# Patient Record
Sex: Female | Born: 1950
Health system: Southern US, Community
[De-identification: ages and names within clinical notes are randomized; demographics above are authoritative.]

## PROBLEM LIST (undated history)

## (undated) DIAGNOSIS — C801 Malignant (primary) neoplasm, unspecified: Secondary | ICD-10-CM

## (undated) DIAGNOSIS — G629 Polyneuropathy, unspecified: Secondary | ICD-10-CM

## (undated) DIAGNOSIS — I739 Peripheral vascular disease, unspecified: Secondary | ICD-10-CM

## (undated) DIAGNOSIS — G459 Transient cerebral ischemic attack, unspecified: Secondary | ICD-10-CM

## (undated) DIAGNOSIS — I10 Essential (primary) hypertension: Secondary | ICD-10-CM

## (undated) DIAGNOSIS — E785 Hyperlipidemia, unspecified: Secondary | ICD-10-CM

## (undated) DIAGNOSIS — G9589 Other specified diseases of spinal cord: Secondary | ICD-10-CM

## (undated) DIAGNOSIS — J449 Chronic obstructive pulmonary disease, unspecified: Secondary | ICD-10-CM

## (undated) DIAGNOSIS — T7840XA Allergy, unspecified, initial encounter: Secondary | ICD-10-CM

## (undated) HISTORY — PX: CERVICAL CONE BIOPSY: SUR198

## (undated) HISTORY — PX: KNEE ARTHROSCOPY: SUR90

## (undated) HISTORY — DX: Allergy, unspecified, initial encounter: T78.40XA

## (undated) HISTORY — DX: Hyperlipidemia, unspecified: E78.5

## (undated) HISTORY — DX: Polyneuropathy, unspecified: G62.9

## (undated) HISTORY — PX: TUBAL LIGATION: SHX77

## (undated) HISTORY — PX: TONSILLECTOMY: SUR1361

---

## 2012-08-31 DIAGNOSIS — I10 Essential (primary) hypertension: Secondary | ICD-10-CM | POA: Insufficient documentation

## 2012-08-31 DIAGNOSIS — C4490 Unspecified malignant neoplasm of skin, unspecified: Secondary | ICD-10-CM | POA: Insufficient documentation

## 2012-08-31 DIAGNOSIS — R011 Cardiac murmur, unspecified: Secondary | ICD-10-CM | POA: Insufficient documentation

## 2013-04-14 DIAGNOSIS — R29898 Other symptoms and signs involving the musculoskeletal system: Secondary | ICD-10-CM | POA: Insufficient documentation

## 2013-09-08 ENCOUNTER — Ambulatory Visit: Payer: Self-pay | Admitting: Internal Medicine

## 2013-09-20 ENCOUNTER — Ambulatory Visit: Payer: Self-pay | Admitting: Internal Medicine

## 2014-05-25 DIAGNOSIS — R918 Other nonspecific abnormal finding of lung field: Secondary | ICD-10-CM | POA: Insufficient documentation

## 2014-07-07 HISTORY — PX: THYMECTOMY: SHX1063

## 2014-07-17 DIAGNOSIS — D4989 Neoplasm of unspecified behavior of other specified sites: Secondary | ICD-10-CM | POA: Insufficient documentation

## 2014-07-17 DIAGNOSIS — D150 Benign neoplasm of thymus: Secondary | ICD-10-CM

## 2015-02-17 ENCOUNTER — Encounter: Payer: Self-pay | Admitting: Internal Medicine

## 2015-02-17 ENCOUNTER — Other Ambulatory Visit: Payer: Self-pay | Admitting: Internal Medicine

## 2015-02-17 DIAGNOSIS — Z8742 Personal history of other diseases of the female genital tract: Secondary | ICD-10-CM | POA: Insufficient documentation

## 2015-02-17 DIAGNOSIS — E782 Mixed hyperlipidemia: Secondary | ICD-10-CM | POA: Insufficient documentation

## 2015-02-17 DIAGNOSIS — E785 Hyperlipidemia, unspecified: Secondary | ICD-10-CM | POA: Insufficient documentation

## 2015-02-17 DIAGNOSIS — F17200 Nicotine dependence, unspecified, uncomplicated: Secondary | ICD-10-CM | POA: Insufficient documentation

## 2015-02-17 DIAGNOSIS — M5136 Other intervertebral disc degeneration, lumbar region: Secondary | ICD-10-CM | POA: Insufficient documentation

## 2015-02-17 DIAGNOSIS — E559 Vitamin D deficiency, unspecified: Secondary | ICD-10-CM | POA: Insufficient documentation

## 2015-03-06 ENCOUNTER — Encounter: Payer: Self-pay | Admitting: Internal Medicine

## 2015-03-06 ENCOUNTER — Ambulatory Visit (INDEPENDENT_AMBULATORY_CARE_PROVIDER_SITE_OTHER): Payer: No Typology Code available for payment source | Admitting: Internal Medicine

## 2015-03-06 VITALS — BP 140/80 | HR 92 | Ht 64.0 in | Wt 166.0 lb

## 2015-03-06 DIAGNOSIS — G959 Disease of spinal cord, unspecified: Secondary | ICD-10-CM | POA: Diagnosis not present

## 2015-03-06 DIAGNOSIS — J449 Chronic obstructive pulmonary disease, unspecified: Secondary | ICD-10-CM | POA: Diagnosis not present

## 2015-03-06 DIAGNOSIS — D150 Benign neoplasm of thymus: Secondary | ICD-10-CM | POA: Diagnosis not present

## 2015-03-06 DIAGNOSIS — M5136 Other intervertebral disc degeneration, lumbar region: Secondary | ICD-10-CM

## 2015-03-06 DIAGNOSIS — R918 Other nonspecific abnormal finding of lung field: Secondary | ICD-10-CM

## 2015-03-06 DIAGNOSIS — F17200 Nicotine dependence, unspecified, uncomplicated: Secondary | ICD-10-CM | POA: Diagnosis not present

## 2015-03-06 DIAGNOSIS — D4989 Neoplasm of unspecified behavior of other specified sites: Secondary | ICD-10-CM

## 2015-03-06 MED ORDER — DICLOFENAC SODIUM 75 MG PO TBEC: 75.0000 mg | DELAYED_RELEASE_TABLET | Freq: Two times a day (BID) | ORAL | Status: DC

## 2015-03-06 MED ORDER — FLUTICASONE-SALMETEROL 100-50 MCG/DOSE IN AEPB: 1.0000 | INHALATION_SPRAY | Freq: Two times a day (BID) | RESPIRATORY_TRACT | Status: DC

## 2015-03-06 MED ORDER — AMOXICILLIN 875 MG PO TABS: 875.0000 mg | ORAL_TABLET | Freq: Two times a day (BID) | ORAL | Status: DC

## 2015-03-06 MED ORDER — ALBUTEROL SULFATE HFA 108 (90 BASE) MCG/ACT IN AERS: 2.0000 | INHALATION_SPRAY | Freq: Four times a day (QID) | RESPIRATORY_TRACT | Status: DC | PRN

## 2015-03-06 NOTE — Progress Notes (Signed)
Date:  03/06/2015   Name:  Chelsea Santana   DOB:  23-Jun-1950   MRN:  [id number]   Chief Complaint: Hypertension; Hyperlipidemia; and Back Pain  Patient had a busy year. She had thymoma resection in April. She's been declared free of malignancy at this time and needs no further follow-up on that. She also was found to have a nonspecific lung mass. Pulmonary consultation was performed last February and they recommended a PET scan. For unclear reasons the patient did not follow-up with that scan. She was also found to have a lesion in one of her vertebral bodies. There was suggestion of this might be related to a rheumatological condition with the specialist did not find any evidence. She is not sure what her follow-up should be. She continues to have chronic back pain as well as multiple other joint pains. She takes diclofenac 75 mg twice a day. She tolerated this medication without side effects and is due for a refill. She also reports that she has fluctuating blood pressures as well as hyperlipidemia. However per her report, she's been unable to tolerate any medications for these conditions. COPD - patient reports that she was given an Advair inhaler by pulmonary but she never filled the prescription. She continues to smoke cigarettes one pack per day. She admits to having wheezing on a daily basis but generally functions well. Recently she developed some sinus drainage with green mucus as well as a cough productive of thick green phlegm.  Review of Systems  Constitutional: Negative for fever, chills, fatigue and unexpected weight change.  HENT: Positive for postnasal drip and sinus pressure. Negative for ear pain, sneezing, sore throat, trouble swallowing and voice change.   Eyes: Negative for visual disturbance.  Respiratory: Positive for cough, shortness of breath and wheezing.   Cardiovascular: Negative for chest pain, palpitations and leg swelling.  Gastrointestinal: Negative for abdominal pain,  diarrhea and constipation.  Musculoskeletal: Positive for myalgias, back pain and arthralgias.  Skin: Negative for color change and rash.  Neurological: Positive for dizziness. Negative for syncope, numbness and headaches.  Psychiatric/Behavioral: Positive for dysphoric mood. Negative for suicidal ideas and sleep disturbance. The patient is not nervous/anxious.     Patient Active Problem List   Diagnosis Date Noted  . Disease of spinal cord ([place]) 03/06/2015  . Degeneration of intervertebral disc of lumbar region 02/17/2015  . H/O abnormal cervical Papanicolaou smear 02/17/2015  . Combined fat and carbohydrate induced hyperlipemia 02/17/2015  . Compulsive tobacco user syndrome 02/17/2015  . Avitaminosis D 02/17/2015  . Thymoma 07/17/2014  . Chronic obstructive pulmonary disease ([place]) 05/25/2014  . Lung mass 05/25/2014  . Leg weakness 04/14/2013  . Cardiac murmur 08/31/2012  . BP (high blood pressure) 08/31/2012  . CA of skin 08/31/2012    Prior to Admission medications   Medication Sig Start Date End Date Taking? Authorizing Provider  Cholecalciferol (VITAMIN D) 2000 UNITS CAPS Take 1 capsule by mouth daily.   Yes Historical Provider, MD  diclofenac (VOLTAREN) 75 MG EC tablet Take 1 tablet by mouth 2 (two) times daily. 04/14/14  Yes Historical Provider, MD  Multiple Vitamins-Minerals (WOMENS 50+ [place] VITAMIN/MIN) TABS Take by mouth.   Yes Historical Provider, MD  OMEGA-3 FATTY ACIDS PO Take by mouth.   Yes Historical Provider, MD    Allergies  Allergen Reactions  . Antihistamines, Chlorpheniramine-Type Other (See Comments)    Makes her feel high  . Fish-Derived Products Other (See Comments)  . Prednisone     lethargy  and anger  . Lipoic Acid Other (See Comments)    Numbness in hands    Past Surgical History  Procedure Laterality Date  . Cervical cone biopsy    . Tonsillectomy    . Tubal ligation    . Knee arthroscopy Bilateral     Social History  Substance Use Topics   . Smoking status: Current Every Day Smoker  . Smokeless tobacco: None  . Alcohol Use: No    Medication list has been reviewed and updated.   Physical Exam  Constitutional: She is oriented to person, place, and time. She appears well-developed. No distress.  HENT:  Head: Normocephalic and atraumatic.  Eyes: Conjunctivae are normal. Right eye exhibits no discharge. Left eye exhibits no discharge. No scleral icterus.  Neck: Carotid bruit is not present.  Cardiovascular: Normal rate, regular rhythm and normal heart sounds.   Pulmonary/Chest: Effort normal. No respiratory distress. She has wheezes. She has rhonchi in the right upper field.  Musculoskeletal: Normal range of motion.  Neurological: She is alert and oriented to person, place, and time.  Skin: Skin is warm and dry. No rash noted.     Psychiatric: She has a normal mood and affect. Her speech is normal and behavior is normal. Thought content normal.    BP 140/80 mmHg  Pulse 92  Ht 5\' 4"  (1.626 m)  Wt 166 lb (75.297 kg)  BMI 28.48 kg/m2  Assessment and Plan: 1. Chronic obstructive pulmonary disease, unspecified COPD type ([place]) With exacerbation and not currently under maintenance therapy - albuterol (PROVENTIL HFA;VENTOLIN HFA) 108 (90 BASE) MCG/ACT inhaler; Inhale 2 puffs into the lungs every 6 (six) hours as needed for wheezing or shortness of breath.  Dispense: 1 Inhaler; Refill: 5 - amoxicillin (AMOXIL) 875 MG tablet; Take 1 tablet (875 mg total) by mouth 2 (two) times daily.  Dispense: 20 tablet; Refill: 0 - Fluticasone-Salmeterol (ADVAIR) 100-50 MCG/DOSE AEPB; Inhale 1 puff into the lungs 2 (two) times daily.  Dispense: 1 each; Refill: 3  2. Degeneration of intervertebral disc of lumbar region Continue current regimen of nonsteroidals - diclofenac (VOLTAREN) 75 MG EC tablet; Take 1 tablet (75 mg total) by mouth 2 (two) times daily.  Dispense: 60 tablet; Refill: 5  3. Compulsive tobacco user syndrome Patient has  no interest in quitting at this time  4. Thymoma Doing well s/p surgery Recent follow up done at [facility]  5. Lung mass Needs to follow up with Pulmonary but changing insurance Will discuss at next appointment in February  6. Disease of spinal cord ([place]) Recent workup suggested benign etiology   Halina Maidens, MD [facility]  03/06/2015

## 2015-05-20 ENCOUNTER — Encounter: Payer: Self-pay | Admitting: Internal Medicine

## 2015-05-20 DIAGNOSIS — I2510 Atherosclerotic heart disease of native coronary artery without angina pectoris: Secondary | ICD-10-CM | POA: Insufficient documentation

## 2015-05-21 ENCOUNTER — Ambulatory Visit (INDEPENDENT_AMBULATORY_CARE_PROVIDER_SITE_OTHER): Payer: BLUE CROSS/BLUE SHIELD | Admitting: Internal Medicine

## 2015-05-21 ENCOUNTER — Encounter: Payer: Self-pay | Admitting: Internal Medicine

## 2015-05-21 VITALS — BP 144/78 | HR 72 | Ht 64.0 in | Wt 167.6 lb

## 2015-05-21 DIAGNOSIS — I2510 Atherosclerotic heart disease of native coronary artery without angina pectoris: Secondary | ICD-10-CM | POA: Diagnosis not present

## 2015-05-21 DIAGNOSIS — E559 Vitamin D deficiency, unspecified: Secondary | ICD-10-CM

## 2015-05-21 DIAGNOSIS — J438 Other emphysema: Secondary | ICD-10-CM

## 2015-05-21 DIAGNOSIS — I10 Essential (primary) hypertension: Secondary | ICD-10-CM

## 2015-05-21 DIAGNOSIS — I712 Thoracic aortic aneurysm, without rupture: Secondary | ICD-10-CM

## 2015-05-21 DIAGNOSIS — Z87898 Personal history of other specified conditions: Secondary | ICD-10-CM

## 2015-05-21 DIAGNOSIS — L259 Unspecified contact dermatitis, unspecified cause: Secondary | ICD-10-CM | POA: Diagnosis not present

## 2015-05-21 DIAGNOSIS — Z23 Encounter for immunization: Secondary | ICD-10-CM | POA: Diagnosis not present

## 2015-05-21 DIAGNOSIS — G959 Disease of spinal cord, unspecified: Secondary | ICD-10-CM | POA: Diagnosis not present

## 2015-05-21 DIAGNOSIS — Z0000 Encounter for general adult medical examination without abnormal findings: Secondary | ICD-10-CM

## 2015-05-21 DIAGNOSIS — I7121 Aneurysm of the ascending aorta, without rupture: Secondary | ICD-10-CM

## 2015-05-21 DIAGNOSIS — Z86018 Personal history of other benign neoplasm: Secondary | ICD-10-CM

## 2015-05-21 LAB — POCT URINALYSIS DIPSTICK
Bilirubin, UA: NEGATIVE
Blood, UA: NEGATIVE
Glucose, UA: NEGATIVE
KETONES UA: NEGATIVE
Leukocytes, UA: NEGATIVE
Nitrite, UA: NEGATIVE
PROTEIN UA: NEGATIVE
Urobilinogen, UA: 0.2
pH, UA: 6.5

## 2015-05-21 MED ORDER — TRIAMCINOLONE ACETONIDE 0.1 % EX CREA: 1.0000 "application " | TOPICAL_CREAM | Freq: Two times a day (BID) | CUTANEOUS | Status: DC

## 2015-05-21 NOTE — Progress Notes (Signed)
Date:  05/21/2015   Name:  Chelsea Santana   DOB:  08-18-1950   MRN:  [id number]   Chief Complaint: Annual Exam Chelsea Santana is a 65 y.o. female who presents today for her Complete Annual Exam. She feels fairly well. She reports exercising very little. She reports she is sleeping fairly well. She denies breast problems and does her own exam.  She does not want to have another mammogram this year. She says she feels fairly well with respect to her COPD. She is not especially interested in any interventions like mammograms or vaccinations although she would take a tetanus vaccine.  Hyperlipidemia Associated symptoms include shortness of breath. Pertinent negatives include no chest pain.    COPD - using Advair twice a day but avoids using it because she really wants to smoke first thing in the AM.  She is not exercising.  She has chronic am phlegm - yellow/brown.  She denies much wheezing. She is never quit smoking other than 1 week prior to her surgery last year. She does not feel that she has the ability to quit due to recurrent anxiety and stressors which lead to her smoking.  Aneurysm - I noted this on a scan done last year prior to her thymoma surgery. At the time the thymoma mass was noted an asymmetry in the aortic aneurysm measuring 3.8 x 4 cm was also noted. The patient states that she was not given this information and it was not addressed prior to or following her surgery.  Below is scan dated 05/26/14: CT chest with contrast CT abdomen and pelvis with contrast  Comparison: Correlation   Indication: R91.1 Solitary pulmonary nodule, Evaluate right lung lesion, eval neoplasia  Technique: CT imaging was performed of the chest, abdomen, and pelvis following the uncomplicated administration of intravenous contrast (Isovue-300, 150 mL at 3 mL/sec). Iodinated contrast was used due to the indications for the examination, to improve disease detection and to further define anatomy. The  most recent serum creatinine is 0.7 mg/dL. 3-D maximal intensity projection (MIP) reconstructions of the chest were performed to potentially increase study sensitivity. Coronal images were also generated and reviewed.  Findings: Chest:   The thyroid is normal. There is a prevascular soft tissue mass in the anterior mediastinum measuring 3.0 x 3.8 x 4.1 cm in the axial plane. There is an ascending aortic aneurysm measuring 4.0 x 3.8 cm. Coronary artery calcifications are seen. The heart is not enlarged. The tracheobronchial tree is patent. No pulmonary nodules or effusions are seen. There is no pneumothorax.  Abdomen/pelvis:  There is no evidence of liver mass. The gallbladder, spleen, pancreas, adrenal glands, and kidneys have an unremarkable appearance. There is no bowel obstruction. The appendix is not definitively seen, but there are no secondary signs of appendicitis. The bladder is unremarkable. Tubal ligation clips are seen in the pelvis. Calcified plaque is seen in the abdominal aorta. There is no free intraperitoneal air or free fluid. No lymphadenopathy is seen.  Bone: No aggressive osseous lesions are seen. There is degenerative change in the lower lumbar spine.  Impression: Soft tissue mass in the anterior mediastinal is favored to be a thymoma.  Electronically Reviewed by:  Lajean Saver, MD Electronically Reviewed on:  05/26/2014 4:06 PM  I have reviewed the images and concur with the above findings.  Electronically Signed by:  Jerrye Bushy, MD Electronically Signed on:  05/26/2014 4:12 PM  Thoracic spine lesion - [name] patient was noted to have a hyperintense  lesion on the right T2 lateral cord. She was intended to have a neurosurgery follow-up by insurance changed and she can no longer go to [facility]. She is very concerned about this lesion and wants referral to a neurosurgeon.  ** MRI THORACIC SPINE WITHOUT CONTRAST ** 05/19/14  INDICATION: M62.81 Muscle weakness  (generalized), G60.9 Hereditary and idiopathic neuropathy, unspecified, G95.9 Disease of spinal cord, unspecified, evaluate myelopathy, right trunk and leg numbness. provided.  COMPARISON: No priors.  TECHNIQUE/PROTOCOL: Thoracic spine noncontrast protocol MRI performed.  FINDINGS: Mild S-shaped scoliosis throughout the thoracic spine. Otherwise, alignment is anatomic. No acute fracture or subluxation. Multilevel Schmorl's nodes noted. Few T1, T2 hyperintense hemangiomas noted. Otherwise, marrow signal is unremarkable. Minimal multilevel degenerative disc changes noted. No significant spinal canal or neuroforaminal narrowing at any level.  Area of T2 hyperintense signal abnormality noted along the right dorsolateral thoracic cord posterior to T5 and T6 which measures up to 0.4 cm in diameter and spans approximately 1.9 cm craniocaudal. Suggestion of mild thinning of the cord at this level. No additional cord signal abnormalities appreciated.  T1 isointense, mildly T2 hyperintense somewhat lobular appearing lesion noted along the anterior aspect of the right midlung which measures approximately 3.2 x 5.5 cm (AP x craniocaudal). Otherwise, included soft tissue structures are unremarkable.  IMPRESSION: 1.Area of T2 hyperintense signal abnormality at the right dorsolateral thoracic cord posterior to T5 and T6 with suggestion of mild thinning of the cord. Finding is nonspecific and may represent chronic myelomalacia secondary to prior demyelination or other myelitis. Active cord lesion remains possible. Recommend further evaluation with contrast-enhanced thoracic spine MRI. 2.Lobular mass noted along the anterior aspect of the right midlung concerning for neoplasm. Recommend dedicated evaluation with chest CT.  Findings discussed with Dr. Ernst Bowler of neurology via telephone by Dr. Lang Snow 05/19/2014 1335 hours.  Electronically Reviewed by: Marlaine Hind, MD Electronically Reviewed  on: 05/19/2014 5:30 PM   Review of Systems  Constitutional: Negative for fever, chills, fatigue and unexpected weight change.  HENT: Negative for congestion and sinus pressure.   Respiratory: Positive for cough and shortness of breath. Negative for chest tightness and wheezing.   Cardiovascular: Negative for chest pain, palpitations and leg swelling.  Gastrointestinal: Negative for abdominal pain, diarrhea, constipation and blood in stool.  Genitourinary: Negative for dysuria, hematuria, vaginal bleeding, vaginal discharge and vaginal pain.  Neurological: Negative for dizziness, tremors, syncope, light-headedness and headaches.  Psychiatric/Behavioral: Positive for sleep disturbance and dysphoric mood. The patient is not nervous/anxious.     Patient Active Problem List   Diagnosis Date Noted  . Ascending aortic aneurysm ([place]) 05/20/2015  . Coronary artery calcification seen on CT scan 05/20/2015  . Disease of spinal cord ([place]) 03/06/2015  . Degeneration of intervertebral disc of lumbar region 02/17/2015  . H/O abnormal cervical Papanicolaou smear 02/17/2015  . Compulsive tobacco user syndrome 02/17/2015  . Avitaminosis D 02/17/2015  . Thymoma 07/17/2014  . Chronic obstructive pulmonary disease ([place]) 05/25/2014  . Leg weakness 04/14/2013  . Cardiac murmur 08/31/2012  . Essential hypertension 08/31/2012  . CA of skin 08/31/2012    Prior to Admission medications   Medication Sig Start Date End Date Taking? Authorizing Provider  albuterol (PROVENTIL HFA;VENTOLIN HFA) 108 (90 BASE) MCG/ACT inhaler Inhale 2 puffs into the lungs every 6 (six) hours as needed for wheezing or shortness of breath. 03/06/15  Yes [name], MD  Cholecalciferol (VITAMIN D) 2000 UNITS CAPS Take 1 capsule by mouth daily.   Yes Historical Provider, MD  diclofenac (VOLTAREN) 75 MG EC tablet Take 1 tablet (75 mg total) by mouth 2 (two) times daily. 03/06/15  Yes [name], MD  ferrous sulfate (SLOW FE)  160 (50 Fe) MG TBCR SR tablet Take 1 tablet by mouth daily.   Yes Historical Provider, MD  Fluticasone-Salmeterol (ADVAIR) 100-50 MCG/DOSE AEPB Inhale 1 puff into the lungs 2 (two) times daily. 03/06/15  Yes [name], MD  Multiple Vitamins-Minerals (WOMENS 50+ [place] VITAMIN/MIN) TABS Take by mouth.   Yes Historical Provider, MD  OMEGA-3 FATTY ACIDS PO Take by mouth.   Yes Historical Provider, MD    Allergies  Allergen Reactions  . Antihistamines, Chlorpheniramine-Type Other (See Comments)    Makes her feel high  . Oxymetazoline Other (See Comments)    Makes her "high"  . Prednisone     lethargy and anger  . Lipoic Acid Other (See Comments)    Numbness in hands    Past Surgical History  Procedure Laterality Date  . Cervical cone biopsy    . Tonsillectomy    . Tubal ligation    . Knee arthroscopy Bilateral   . Thymectomy  07/2014    Social History  Substance Use Topics  . Smoking status: Current Every Day Smoker -- 1.00 packs/day for 48 years    Types: Cigarettes  . Smokeless tobacco: None  . Alcohol Use: No     Medication list has been reviewed and updated.   Physical Exam  Constitutional: She is oriented to person, place, and time. She appears well-developed and well-nourished. No distress.  HENT:  Head: Normocephalic and atraumatic.  Right Ear: Tympanic membrane and ear canal normal.  Left Ear: Tympanic membrane and ear canal normal.  Nose: Right sinus exhibits no maxillary sinus tenderness. Left sinus exhibits no maxillary sinus tenderness.  Mouth/Throat: Uvula is midline and oropharynx is clear and moist.  Eyes: Conjunctivae and EOM are normal. Right eye exhibits no discharge. Left eye exhibits no discharge. No scleral icterus.  Neck: Normal range of motion. Carotid bruit is not present. No erythema present. No thyromegaly present.  Cardiovascular: Normal rate, regular rhythm, normal heart sounds and normal pulses.   Pulmonary/Chest: Effort normal. No  accessory muscle usage. No respiratory distress. She has decreased breath sounds. She has no wheezes. Right breast exhibits no mass, no nipple discharge, no skin change and no tenderness. Left breast exhibits no mass, no nipple discharge, no skin change and no tenderness.  Abdominal: Soft. Bowel sounds are normal. There is no hepatosplenomegaly. There is no tenderness. There is no CVA tenderness.  Musculoskeletal: Normal range of motion.  Lymphadenopathy:    She has no cervical adenopathy.    She has no axillary adenopathy.  Neurological: She is alert and oriented to person, place, and time. She has normal reflexes. No cranial nerve deficit or sensory deficit.  Skin: Skin is warm, dry and intact. Rash noted. Rash is vesicular (along inner left forearm (linear pattern) and several scatted lesions on left hand and fingers).  Psychiatric: She has a normal mood and affect. Her speech is normal and behavior is normal. Thought content normal.  Nursing note and vitals reviewed.   BP 144/78 mmHg  Pulse 72  Ht 5\' 4"  (1.626 m)  Wt 167 lb 9.6 oz (76.023 kg)  BMI 28.75 kg/m2  Assessment and Plan: 1. Annual physical exam Patient declines mammogram - Lipid panel - POCT Urinalysis Dipstick  2. Essential hypertension Fair control - higher likely due to discussion regarding aneurysm -  CBC with Differential/Platelet - Comprehensive metabolic panel - TSH  3. Ascending aortic aneurysm [facility]) - Ambulatory referral to Vascular Surgery  4. Other emphysema ([place]) Pt encouraged to cut back on smoking - although she is not interested at this time Pt desires to hold off on Pulmonary referral since her symptoms are stable at this time Continue Advair   5. Coronary artery calcification seen on CT scan Would benefit from statin therapy She has not tolerated several in the past  6. Need for diphtheria-tetanus-pertussis (Tdap) vaccine - Tdap vaccine greater than or equal to 7yo IM  7. Contact  dermatitis - triamcinolone cream (KENALOG) 0.1 %; Apply 1 application topically 2 (two) times daily.  Dispense: 30 g; Refill: 0  8. History of thymoma Due for Oncology followup (one year) - Ambulatory referral to Oncology  9. Avitaminosis D Will advise on supplementation - VITAMIN D 25 Hydroxy (Vit-D Deficiency, Fractures)  10. Disease of spinal cord ([facility]) Focal T2 hyperintense lesion within the right dorsal lateral cord at this T5-T6 appears unchanged. There is no associated cord expansion or contrast-enhancement on MRI 2016 -Ambulatory referral to Neurosurgery  Halina Maidens, MD [facility]  05/21/2015

## 2015-05-21 NOTE — Patient Instructions (Signed)
Tdap Vaccine (Tetanus, Diphtheria and Pertussis): What You Need to Know 1. Why get vaccinated? Tetanus, diphtheria and pertussis are very serious diseases. Tdap vaccine can protect us from these diseases. And, Tdap vaccine given to pregnant women can protect newborn babies against pertussis. TETANUS (Lockjaw) is rare in the [place] today. It causes painful muscle tightening and stiffness, usually all over the body.  It can lead to tightening of muscles in the head and neck so you can't open your mouth, swallow, or sometimes even breathe. Tetanus kills about 1 out of 10 people who are infected even after receiving the best medical care. DIPHTHERIA is also rare in the [place] today. It can cause a thick coating to form in the back of the throat.  It can lead to breathing problems, heart failure, paralysis, and death. PERTUSSIS (Whooping Cough) causes severe coughing spells, which can cause difficulty breathing, vomiting and disturbed sleep.  It can also lead to weight loss, incontinence, and rib fractures. Up to 2 in 100 adolescents and 5 in 100 adults with pertussis are hospitalized or have complications, which could include pneumonia or death. These diseases are caused by bacteria. Diphtheria and pertussis are spread from person to person through secretions from coughing or sneezing. Tetanus enters the body through cuts, scratches, or wounds. Before vaccines, as many as 200,000 cases of diphtheria, 200,000 cases of pertussis, and hundreds of cases of tetanus, were reported in the [place] each year. Since vaccination began, reports of cases for tetanus and diphtheria have dropped by about 99% and for pertussis by about 80%. 2. Tdap vaccine Tdap vaccine can protect adolescents and adults from tetanus, diphtheria, and pertussis. One dose of Tdap is routinely given at age 11 or 12. People who did not get Tdap at that age should get it as soon as possible. Tdap is especially important  for healthcare professionals and anyone having close contact with a baby younger than 12 months. Pregnant women should get a dose of Tdap during every pregnancy, to protect the newborn from pertussis. Infants are most at risk for severe, life-threatening complications from pertussis. Another vaccine, called Td, protects against tetanus and diphtheria, but not pertussis. A Td booster should be given every 10 years. Tdap may be given as one of these boosters if you have never gotten Tdap before. Tdap may also be given after a severe cut or burn to prevent tetanus infection. Your doctor or the person giving you the vaccine can give you more information. Tdap may safely be given at the same time as other vaccines. 3. Some people should not get this vaccine  A person who has ever had a life-threatening allergic reaction after a previous dose of any diphtheria, tetanus or pertussis containing vaccine, OR has a severe allergy to any part of this vaccine, should not get Tdap vaccine. Tell the person giving the vaccine about any severe allergies.  Anyone who had coma or long repeated seizures within 7 days after a childhood dose of DTP or DTaP, or a previous dose of Tdap, should not get Tdap, unless a cause other than the vaccine was found. They can still get Td.  Talk to your doctor if you:  have seizures or another nervous system problem,  had severe pain or swelling after any vaccine containing diphtheria, tetanus or pertussis,  ever had a condition called Guillain-Barr Syndrome (GBS),  aren't feeling well on the day the shot is scheduled. 4. Risks With any medicine, including vaccines, there is   a chance of side effects. These are usually mild and go away on their own. Serious reactions are also possible but are rare. Most people who get Tdap vaccine do not have any problems with it. Mild problems following Tdap (Did not interfere with activities)  Pain where the shot was given (about 3 in 4  adolescents or 2 in 3 adults)  Redness or swelling where the shot was given (about 1 person in 5)  Mild fever of at least 100.4F (up to about 1 in 25 adolescents or 1 in 100 adults)  Headache (about 3 or 4 people in 10)  Tiredness (about 1 person in 3 or 4)  Nausea, vomiting, diarrhea, stomach ache (up to 1 in 4 adolescents or 1 in 10 adults)  Chills, sore joints (about 1 person in 10)  Body aches (about 1 person in 3 or 4)  Rash, swollen glands (uncommon) Moderate problems following Tdap (Interfered with activities, but did not require medical attention)  Pain where the shot was given (up to 1 in 5 or 6)  Redness or swelling where the shot was given (up to about 1 in 16 adolescents or 1 in 12 adults)  Fever over 102F (about 1 in 100 adolescents or 1 in 250 adults)  Headache (about 1 in 7 adolescents or 1 in 10 adults)  Nausea, vomiting, diarrhea, stomach ache (up to 1 or 3 people in 100)  Swelling of the entire arm where the shot was given (up to about 1 in 500). Severe problems following Tdap (Unable to perform usual activities; required medical attention)  Swelling, severe pain, bleeding and redness in the arm where the shot was given (rare). Problems that could happen after any vaccine:  People sometimes faint after a medical procedure, including vaccination. Sitting or lying down for about 15 minutes can help prevent fainting, and injuries caused by a fall. Tell your doctor if you feel dizzy, or have vision changes or ringing in the ears.  Some people get severe pain in the shoulder and have difficulty moving the arm where a shot was given. This happens very rarely.  Any medication can cause a severe allergic reaction. Such reactions from a vaccine are very rare, estimated at fewer than 1 in a million doses, and would happen within a few minutes to a few hours after the vaccination. As with any medicine, there is a very remote chance of a vaccine causing a serious  injury or death. The safety of vaccines is always being monitored. For more information, visit: www.cdc.gov/vaccinesafety/ 5. What if there is a serious problem? What should I look for?  Look for anything that concerns you, such as signs of a severe allergic reaction, very high fever, or unusual behavior.  Signs of a severe allergic reaction can include hives, swelling of the face and throat, difficulty breathing, a fast heartbeat, dizziness, and weakness. These would usually start a few minutes to a few hours after the vaccination. What should I do?  If you think it is a severe allergic reaction or other emergency that can't wait, call 9-1-1 or get the person to the nearest hospital. Otherwise, call your doctor.  Afterward, the reaction should be reported to the Vaccine Adverse Event Reporting System (VAERS). Your doctor might file this report, or you can do it yourself through the VAERS web site at www.vaers.hhs.gov, or by calling 1-800-822-7967. VAERS does not give medical advice.  6. The National Vaccine Injury Compensation Program The National Vaccine Injury Compensation Program (  VICP) is a federal program that was created to compensate people who may have been injured by certain vaccines. Persons who believe they may have been injured by a vaccine can learn about the program and about filing a claim by calling 1-800-338-2382 or visiting the VICP website at www.hrsa.gov/vaccinecompensation. There is a time limit to file a claim for compensation. 7. How can I learn more?  Ask your doctor. He or she can give you the vaccine package insert or suggest other sources of information.  Call your local or state health department.  Contact the Centers for Disease Control and Prevention (CDC):  Call 1-800-232-4636 (1-800-CDC-INFO) or  Visit CDC's website at www.cdc.gov/vaccines CDC Tdap Vaccine VIS (05/31/13)   This information is not intended to replace advice given to you by your health care  provider. Make sure you discuss any questions you have with your health care provider.   Document Released: 09/23/2011 Document Revised: 04/14/2014 Document Reviewed: 07/06/2013 Elsevier Interactive Patient Education 2016 Elsevier Inc.  

## 2015-05-23 LAB — COMPREHENSIVE METABOLIC PANEL
ALK PHOS: 67 IU/L (ref 39–117)
ALT: 18 IU/L (ref 0–32)
AST: 18 IU/L (ref 0–40)
Albumin/Globulin Ratio: 1.6 (ref 1.1–2.5)
Albumin: 4.2 g/dL (ref 3.6–4.8)
BUN/Creatinine Ratio: 11 (ref 11–26)
BUN: 7 mg/dL — AB (ref 8–27)
Bilirubin Total: 0.3 mg/dL (ref 0.0–1.2)
CALCIUM: 9.2 mg/dL (ref 8.7–10.3)
CO2: 23 mmol/L (ref 18–29)
CREATININE: 0.66 mg/dL (ref 0.57–1.00)
Chloride: 97 mmol/L (ref 96–106)
GFR calc Af Amer: 109 mL/min/{1.73_m2} (ref >59)
GFR, EST NON AFRICAN AMERICAN: 95 mL/min/{1.73_m2} (ref >59)
GLUCOSE: 85 mg/dL (ref 65–99)
Globulin, Total: 2.7 g/dL (ref 1.5–4.5)
Potassium: 4.8 mmol/L (ref 3.5–5.2)
SODIUM: 137 mmol/L (ref 134–144)
Total Protein: 6.9 g/dL (ref 6.0–8.5)

## 2015-05-23 LAB — CBC WITH DIFFERENTIAL/PLATELET
BASOS ABS: 0 10*3/uL (ref 0.0–0.2)
Basos: 0 %
EOS (ABSOLUTE): 0.1 10*3/uL (ref 0.0–0.4)
Eos: 1 %
HEMOGLOBIN: 12.6 g/dL (ref 11.1–15.9)
Hematocrit: 38.9 % (ref 34.0–46.6)
Immature Grans (Abs): 0 10*3/uL (ref 0.0–0.1)
Immature Granulocytes: 0 %
LYMPHS ABS: 1.9 10*3/uL (ref 0.7–3.1)
Lymphs: 23 %
MCH: 29.8 pg (ref 26.6–33.0)
MCHC: 32.4 g/dL (ref 31.5–35.7)
MCV: 92 fL (ref 79–97)
MONOCYTES: 8 %
MONOS ABS: 0.7 10*3/uL (ref 0.1–0.9)
NEUTROS ABS: 5.6 10*3/uL (ref 1.4–7.0)
Neutrophils: 68 %
Platelets: 352 10*3/uL (ref 150–379)
RBC: 4.23 x10E6/uL (ref 3.77–5.28)
RDW: 14.3 % (ref 12.3–15.4)
WBC: 8.3 10*3/uL (ref 3.4–10.8)

## 2015-05-23 LAB — LIPID PANEL
CHOL/HDL RATIO: 3 ratio (ref 0.0–4.4)
CHOLESTEROL TOTAL: 201 mg/dL — AB (ref 100–199)
HDL: 68 mg/dL (ref >39)
LDL CALC: 120 mg/dL — AB (ref 0–99)
TRIGLYCERIDES: 63 mg/dL (ref 0–149)
VLDL CHOLESTEROL CAL: 13 mg/dL (ref 5–40)

## 2015-05-23 LAB — VITAMIN D 25 HYDROXY (VIT D DEFICIENCY, FRACTURES): Vit D, 25-Hydroxy: 34 ng/mL (ref 30.0–100.0)

## 2015-05-23 LAB — TSH: TSH: 0.854 u[IU]/mL (ref 0.450–4.500)

## 2015-05-24 ENCOUNTER — Telehealth: Payer: Self-pay

## 2015-05-24 NOTE — Telephone Encounter (Signed)
-----   Message from [name], MD sent at 05/24/2015  1:19 PM EST ----- Vitamin D is low normal range.  Continue daily supplement.  All other labs are normal.

## 2015-05-24 NOTE — Telephone Encounter (Signed)
Spoke with patient. Patient advised of all results and verbalized understanding. Will call back with any future questions or concerns. MAH  

## 2015-05-31 ENCOUNTER — Other Ambulatory Visit: Payer: Self-pay | Admitting: Vascular Surgery

## 2015-05-31 DIAGNOSIS — I716 Thoracoabdominal aortic aneurysm, without rupture, unspecified: Secondary | ICD-10-CM

## 2015-05-31 DIAGNOSIS — R109 Unspecified abdominal pain: Secondary | ICD-10-CM

## 2015-05-31 DIAGNOSIS — R079 Chest pain, unspecified: Secondary | ICD-10-CM

## 2015-06-01 ENCOUNTER — Inpatient Hospital Stay: Payer: BLUE CROSS/BLUE SHIELD | Attending: Oncology | Admitting: Oncology

## 2015-06-01 VITALS — BP 157/96 | HR 72 | Temp 97.9°F | Resp 20 | Wt 167.5 lb

## 2015-06-01 DIAGNOSIS — F17210 Nicotine dependence, cigarettes, uncomplicated: Secondary | ICD-10-CM | POA: Insufficient documentation

## 2015-06-01 DIAGNOSIS — Z801 Family history of malignant neoplasm of trachea, bronchus and lung: Secondary | ICD-10-CM | POA: Insufficient documentation

## 2015-06-01 DIAGNOSIS — D4989 Neoplasm of unspecified behavior of other specified sites: Secondary | ICD-10-CM

## 2015-06-01 DIAGNOSIS — Z79899 Other long term (current) drug therapy: Secondary | ICD-10-CM | POA: Diagnosis not present

## 2015-06-01 DIAGNOSIS — D150 Benign neoplasm of thymus: Secondary | ICD-10-CM

## 2015-06-01 DIAGNOSIS — Z87898 Personal history of other specified conditions: Secondary | ICD-10-CM | POA: Insufficient documentation

## 2015-06-01 NOTE — Progress Notes (Signed)
Patient here today for new evaluation regarding thymoma. Patient reports being diagnosed and treated at [facility] about 1 year ago, reports most recent scan in October. Patient reports she is here today to establish care.

## 2015-06-01 NOTE — Progress Notes (Signed)
[place]  [phone] Fax:([phone]) [phone]  ID: Billy Coast OB: 05-23-1950  MR#: [id number]  [id number]  Patient Care Team: [name], MD as PCP - General (Family Medicine)  CHIEF COMPLAINT:  Chief Complaint  Patient presents with  . New Evaluation    Thymoma    INTERVAL HISTORY: Patient is a 65 year old female who proximally one year ago was having a workup for right sided numbness and tingling by neurology. This included a CT of the chest which revealed a mediastinal mass. Subsequent surgery revealed a stage IIA thymoma. Patient would like to transfer her care from [facility]. She currently feels well and is asymptomatic. She has no neurologic complaints. She denies any recent fevers or illnesses. She has a good appetite and denies weight loss. She has no chest pain or shortness of breath. She denies any nausea, vomiting, constipation, or diarrhea. She has no urinary complaints. Patient feels at her baseline and offers no specific complaints today.  REVIEW OF SYSTEMS:   Review of Systems  Constitutional: Negative.  Negative for fever, weight loss and malaise/fatigue.  Respiratory: Negative.  Negative for cough, hemoptysis and shortness of breath.   Cardiovascular: Negative.  Negative for chest pain.  Gastrointestinal: Negative.   Musculoskeletal: Negative.   Neurological: Negative.  Negative for weakness.    As per HPI. Otherwise, a complete review of systems is negatve.  PAST MEDICAL HISTORY: No past medical history on file.  PAST SURGICAL HISTORY: Past Surgical History  Procedure Laterality Date  . Cervical cone biopsy    . Tonsillectomy    . Tubal ligation    . Knee arthroscopy Bilateral   . Thymectomy  07/2014    FAMILY HISTORY Family History  Problem Relation Age of Onset  . Bipolar disorder Son   . Lung cancer Father        ADVANCED DIRECTIVES:    HEALTH MAINTENANCE: Social History  Substance Use Topics    . Smoking status: Current Every Day Smoker -- 1.00 packs/day for 48 years    Types: Cigarettes  . Smokeless tobacco: Not on file  . Alcohol Use: No     Colonoscopy:  PAP:  Bone density:  Lipid panel:  Allergies  Allergen Reactions  . Antihistamines, Chlorpheniramine-Type Other (See Comments)    Makes her feel high  . Oxymetazoline Other (See Comments)    Makes her "high"  . Prednisone     lethargy and anger  . Lipoic Acid Other (See Comments)    Numbness in hands    Current Outpatient Prescriptions  Medication Sig Dispense Refill  . albuterol (PROVENTIL HFA;VENTOLIN HFA) 108 (90 BASE) MCG/ACT inhaler Inhale 2 puffs into the lungs every 6 (six) hours as needed for wheezing or shortness of breath. 1 Inhaler 5  . Cholecalciferol (VITAMIN D) 2000 UNITS CAPS Take 1 capsule by mouth daily.    . diclofenac (VOLTAREN) 75 MG EC tablet Take 1 tablet (75 mg total) by mouth 2 (two) times daily. 60 tablet 5  . ferrous sulfate (SLOW FE) 160 (50 Fe) MG TBCR SR tablet Take 1 tablet by mouth daily.    . Fluticasone-Salmeterol (ADVAIR) 100-50 MCG/DOSE AEPB Inhale 1 puff into the lungs 2 (two) times daily. 1 each 3  . Multiple Vitamins-Minerals (WOMENS 50+ MULTI VITAMIN/MIN) TABS Take by mouth.    . OMEGA-3 FATTY ACIDS PO Take by mouth.    . triamcinolone cream (KENALOG) 0.1 % Apply 1 application topically 2 (two) times daily. 30 g 0  No current facility-administered medications for this visit.    OBJECTIVE: Filed Vitals:   06/01/15 1016  BP: 157/96  Pulse: 72  Temp: 97.9 F (36.6 C)  Resp: 20     Body mass index is 28.75 kg/(m^2).    ECOG FS:0 - Asymptomatic  General: Well-developed, well-nourished, no acute distress. Eyes: Pink conjunctiva, anicteric sclera. HEENT: Normocephalic, moist mucous membranes, clear oropharnyx. Lungs: Clear to auscultation bilaterally. Heart: Regular rate and rhythm. No rubs, murmurs, or gallops. Abdomen: Soft, nontender, nondistended. No organomegaly  noted, normoactive bowel sounds. Musculoskeletal: No edema, cyanosis, or clubbing. Neuro: Alert, answering all questions appropriately. Cranial nerves grossly intact. Skin: No rashes or petechiae noted. Psych: Normal affect. Lymphatics: No cervical, calvicular, axillary or inguinal LAD.   LAB RESULTS:  Lab Results  Component Value Date   NA 137 05/21/2015   K 4.8 05/21/2015   CL 97 05/21/2015   CO2 23 05/21/2015   GLUCOSE 85 05/21/2015   BUN 7* 05/21/2015   CREATININE 0.66 05/21/2015   CALCIUM 9.2 05/21/2015   PROT 6.9 05/21/2015   ALBUMIN 4.2 05/21/2015   AST 18 05/21/2015   ALT 18 05/21/2015   ALKPHOS 67 05/21/2015   BILITOT 0.3 05/21/2015   GFRNONAA 95 05/21/2015   GFRAA 109 05/21/2015    Lab Results  Component Value Date   WBC 8.3 05/21/2015   NEUTROABS 5.6 05/21/2015   HCT 38.9 05/21/2015   MCV 92 05/21/2015   PLT 352 05/21/2015     STUDIES: No results found.  ASSESSMENT: Stage IIa thymoma.  PLAN:    1. Thymoma: Patient's laboratory work, imaging, and consult notes from outside facility were reviewed extensively and independently.   Patient was diagnosed in April 2016. Surgical margins reported as negative with only microscopic transcapsular invasion. Patient was also evaluated by radiation oncology at that time and it was determined that no XRT was necessary. Her most recent CT scan in October 2016 did not reveal any evidence of recurrence. We will continue routine surveillance every 6 months with CT scans. Patient's next scan will be scheduled in April 2017 and she will follow-up 1-2 days later.  Patient expressed understanding and was in agreement with this plan. She also understands that She can call clinic at any time with any questions, concerns, or complaints.   Lloyd Huger, MD   06/01/2015 2:23 PM

## 2015-06-07 ENCOUNTER — Ambulatory Visit
Admission: RE | Admit: 2015-06-07 | Discharge: 2015-06-07 | Disposition: A | Discharge status: Home / Self Care | Payer: BLUE CROSS/BLUE SHIELD | Source: Ambulatory Visit | Attending: Vascular Surgery | Admitting: Vascular Surgery

## 2015-06-07 DIAGNOSIS — R109 Unspecified abdominal pain: Secondary | ICD-10-CM

## 2015-06-07 DIAGNOSIS — K5730 Diverticulosis of large intestine without perforation or abscess without bleeding: Secondary | ICD-10-CM | POA: Insufficient documentation

## 2015-06-07 DIAGNOSIS — K6389 Other specified diseases of intestine: Secondary | ICD-10-CM | POA: Insufficient documentation

## 2015-06-07 DIAGNOSIS — I712 Thoracic aortic aneurysm, without rupture: Secondary | ICD-10-CM | POA: Insufficient documentation

## 2015-06-07 DIAGNOSIS — I716 Thoracoabdominal aortic aneurysm, without rupture, unspecified: Secondary | ICD-10-CM

## 2015-06-07 DIAGNOSIS — R079 Chest pain, unspecified: Secondary | ICD-10-CM | POA: Insufficient documentation

## 2015-06-07 HISTORY — DX: Malignant (primary) neoplasm, unspecified: C80.1

## 2015-06-07 MED ORDER — IOHEXOL 350 MG/ML SOLN
100.0000 mL | Freq: Once | INTRAVENOUS | Status: AC | PRN
  Administered 2015-06-07: 100 mL via INTRAVENOUS

## 2015-06-22 ENCOUNTER — Telehealth: Payer: Self-pay | Admitting: *Deleted

## 2015-06-22 NOTE — Telephone Encounter (Signed)
She does not require a repeat CT scan in April. That scan can be canceled, but keep follow-up with M.D. as scheduled.

## 2015-06-22 NOTE — Telephone Encounter (Signed)
Done

## 2015-06-22 NOTE — Telephone Encounter (Signed)
Chelsea Santana, please cancel the CT see [place] note below, I will call patient and let her know it is cancelled. I called pt to let her know CT will be cancelled and to keep FU appt as schdeuled, she repeated this back to me

## 2015-06-22 NOTE — Telephone Encounter (Signed)
She is scheduled for CT chest in April, but [facility] did a CT angio on 3/2, so she is asking if she needs to have another CT done in April or can it be cancelled

## 2015-06-25 ENCOUNTER — Encounter: Payer: Self-pay | Admitting: Internal Medicine

## 2015-06-25 ENCOUNTER — Ambulatory Visit (INDEPENDENT_AMBULATORY_CARE_PROVIDER_SITE_OTHER): Payer: BLUE CROSS/BLUE SHIELD | Admitting: Internal Medicine

## 2015-06-25 VITALS — BP 142/90 | HR 80 | Ht 64.0 in | Wt 168.0 lb

## 2015-06-25 DIAGNOSIS — L509 Urticaria, unspecified: Secondary | ICD-10-CM | POA: Diagnosis not present

## 2015-06-25 DIAGNOSIS — J438 Other emphysema: Secondary | ICD-10-CM | POA: Diagnosis not present

## 2015-06-25 MED ORDER — PREDNISONE 10 MG PO TABS: 30.0000 mg | ORAL_TABLET | Freq: Every day | ORAL | Status: DC

## 2015-06-25 NOTE — Progress Notes (Signed)
Date:  06/25/2015   Name:  Chelsea Santana   DOB:  03/20/1951   MRN:  [id number]   Chief Complaint: Urticaria Urticaria This is a new problem. The current episode started 1 to 4 weeks ago. The problem is unchanged. The affected locations include the scalp and face. The rash is characterized by itchiness and redness. Associated with: she thinks it is from  [place]. Pertinent negatives include no cough, fatigue, fever or shortness of breath.  She has developed a rash with hives three separate times after Advair usage.  The first 2 times she only used Advair for a few days and the rash resolved after several days.  Most recently she used Advair for several weeks but stopped it after rash started again.  This time the rash has not resolved.  She has tried different shampoos as well as Benadryl with no benefit.    Review of Systems  Constitutional: Negative for fever, chills and fatigue.  HENT: Negative for facial swelling and trouble swallowing.   Eyes: Negative for visual disturbance.  Respiratory: Negative for cough, chest tightness and shortness of breath.   Cardiovascular: Negative for chest pain, palpitations and leg swelling.  Skin: Positive for rash.  Allergic/Immunologic: Negative for environmental allergies and food allergies.  Neurological: Negative for dizziness and headaches.  Psychiatric/Behavioral: Negative for sleep disturbance and dysphoric mood.    Patient Active Problem List   Diagnosis Date Noted  . Ascending aortic aneurysm ([place]) 05/20/2015  . Coronary artery calcification seen on CT scan 05/20/2015  . Disease of spinal cord ([place]) 03/06/2015  . Degeneration of intervertebral disc of lumbar region 02/17/2015  . H/O abnormal cervical Papanicolaou smear 02/17/2015  . Compulsive tobacco user syndrome 02/17/2015  . Avitaminosis D 02/17/2015  . Thymoma 07/17/2014  . Chronic obstructive pulmonary disease ([place]) 05/25/2014  . Leg weakness 04/14/2013  . Cardiac murmur  08/31/2012  . Essential hypertension 08/31/2012  . CA of skin 08/31/2012    Prior to Admission medications   Medication Sig Start Date End Date Taking? Authorizing Provider  albuterol (PROVENTIL HFA;VENTOLIN HFA) 108 (90 BASE) MCG/ACT inhaler Inhale 2 puffs into the lungs every 6 (six) hours as needed for wheezing or shortness of breath. 03/06/15   [name], MD  Cholecalciferol (VITAMIN D) 2000 UNITS CAPS Take 1 capsule by mouth daily.    Historical Provider, MD  diclofenac (VOLTAREN) 75 MG EC tablet Take 1 tablet (75 mg total) by mouth 2 (two) times daily. 03/06/15   [name], MD  ferrous sulfate (SLOW FE) 160 (50 Fe) MG TBCR SR tablet Take 1 tablet by mouth daily.    Historical Provider, MD  Fluticasone-Salmeterol (ADVAIR) 100-50 MCG/DOSE AEPB Inhale 1 puff into the lungs 2 (two) times daily. 03/06/15   [name], MD  Multiple Vitamins-Minerals (WOMENS 50+ [place] VITAMIN/MIN) TABS Take by mouth.    Historical Provider, MD  OMEGA-3 FATTY ACIDS PO Take by mouth.    Historical Provider, MD  triamcinolone cream (KENALOG) 0.1 % Apply 1 application topically 2 (two) times daily. 05/21/15   [name], MD    Allergies  Allergen Reactions  . Antihistamines, Chlorpheniramine-Type Other (See Comments)    Makes her feel high  . Oxymetazoline Other (See Comments)    Makes her "high"  . Prednisone     lethargy and anger  . Lipoic Acid Other (See Comments)    Numbness in hands    Past Surgical History  Procedure Laterality Date  .  Cervical cone biopsy    . Tonsillectomy    . Tubal ligation    . Knee arthroscopy Bilateral   . Thymectomy  07/2014    Social History  Substance Use Topics  . Smoking status: Current Every Day Smoker -- 1.00 packs/day for 48 years    Types: Cigarettes  . Smokeless tobacco: None  . Alcohol Use: No     Medication list has been reviewed and updated.   Physical Exam  Constitutional: She is oriented to person, place, and time. She  appears well-developed. No distress.  HENT:  Head: Normocephalic and atraumatic.  Cardiovascular: Regular rhythm and normal heart sounds.   Pulmonary/Chest: Effort normal and breath sounds normal. No respiratory distress.  Musculoskeletal: Normal range of motion.  Neurological: She is alert and oriented to person, place, and time.  Skin: Skin is warm and dry. Rash noted. Rash is urticarial.     Psychiatric: She has a normal mood and affect. Her behavior is normal. Thought content normal.  Nursing note and vitals reviewed.   BP 142/90 mmHg  Pulse 80  Ht 5\' 4"  (1.626 m)  Wt 168 lb (76.204 kg)  BMI 28.82 kg/m2  Assessment and Plan: 1. Urticaria Could also try zantac and/or claritin - predniSONE (DELTASONE) 10 MG tablet; Take 3 tablets (30 mg total) by mouth daily with breakfast.  Dispense: 12 tablet; Refill: 0  2. Other emphysema ([place]) Continue albuterol MDI as needed  Halina Maidens, MD [facility]  06/25/2015

## 2015-06-27 ENCOUNTER — Other Ambulatory Visit: Payer: Self-pay | Admitting: Otolaryngology

## 2015-06-27 DIAGNOSIS — E041 Nontoxic single thyroid nodule: Secondary | ICD-10-CM

## 2015-07-04 ENCOUNTER — Other Ambulatory Visit: Payer: Self-pay | Admitting: Internal Medicine

## 2015-07-18 ENCOUNTER — Ambulatory Visit: Payer: Self-pay

## 2015-08-03 ENCOUNTER — Inpatient Hospital Stay: Payer: BLUE CROSS/BLUE SHIELD | Attending: Oncology | Admitting: Oncology

## 2015-08-03 VITALS — BP 149/79 | HR 92 | Temp 97.2°F | Resp 18 | Wt 166.2 lb

## 2015-08-03 DIAGNOSIS — Z801 Family history of malignant neoplasm of trachea, bronchus and lung: Secondary | ICD-10-CM | POA: Diagnosis not present

## 2015-08-03 DIAGNOSIS — Z79899 Other long term (current) drug therapy: Secondary | ICD-10-CM | POA: Insufficient documentation

## 2015-08-03 DIAGNOSIS — E041 Nontoxic single thyroid nodule: Secondary | ICD-10-CM | POA: Diagnosis not present

## 2015-08-03 DIAGNOSIS — Z87891 Personal history of nicotine dependence: Secondary | ICD-10-CM

## 2015-08-03 DIAGNOSIS — D150 Benign neoplasm of thymus: Secondary | ICD-10-CM | POA: Insufficient documentation

## 2015-08-03 DIAGNOSIS — D4989 Neoplasm of unspecified behavior of other specified sites: Secondary | ICD-10-CM

## 2015-08-03 DIAGNOSIS — K639 Disease of intestine, unspecified: Secondary | ICD-10-CM

## 2015-08-03 NOTE — Progress Notes (Signed)
[facility]  [phone] [phone]) [phone]  ID: Chelsea Santana OB: 1950/06/29  MR#: [id number]  [id number]  Patient Care Team: [name], MD as PCP - General (Family Medicine)  CHIEF COMPLAINT:  Chief Complaint  Patient presents with  . thymoma    INTERVAL HISTORY: Patient is here for 6 month follow up with CT results. She currently feels well and is asymptomatic. She has no neurologic complaints. She denies any recent fevers or illnesses. She has a good appetite and denies weight loss. She has no chest pain or shortness of breath. She denies any nausea, vomiting, constipation, or diarrhea. She has no urinary complaints. Patient feels at her baseline and offers no specific complaints today.  REVIEW OF SYSTEMS:   Review of Systems  Constitutional: Negative.  Negative for fever, weight loss and malaise/fatigue.  Respiratory: Negative.  Negative for cough, hemoptysis and shortness of breath.   Cardiovascular: Negative.  Negative for chest pain.  Gastrointestinal: Negative.   Musculoskeletal: Negative.   Neurological: Negative.  Negative for weakness.    As per HPI. Otherwise, a complete review of systems is negatve.  PAST MEDICAL HISTORY: Past Medical History  Diagnosis Date  . Cancer ([place])     Thymoma, Stage II    PAST SURGICAL HISTORY: Past Surgical History  Procedure Laterality Date  . Cervical cone biopsy    . Tonsillectomy    . Tubal ligation    . Knee arthroscopy Bilateral   . Thymectomy  07/2014    FAMILY HISTORY Family History  Problem Relation Age of Onset  . Bipolar disorder Son   . Lung cancer Father        ADVANCED DIRECTIVES:    HEALTH MAINTENANCE: Social History  Substance Use Topics  . Smoking status: Current Every Day Smoker -- 1.00 packs/day for 48 years    Types: Cigarettes  . Smokeless tobacco: Not on file  . Alcohol Use: No    Allergies  Allergen Reactions  . Antihistamines,  Chlorpheniramine-Type Other (See Comments)    Makes her feel high  . Oxymetazoline Other (See Comments)    Makes her "high"  . Prednisone     lethargy and anger  . Lipoic Acid Other (See Comments)    Numbness in hands    Current Outpatient Prescriptions  Medication Sig Dispense Refill  . albuterol (PROVENTIL HFA;VENTOLIN HFA) 108 (90 BASE) MCG/ACT inhaler Inhale 2 puffs into the lungs every 6 (six) hours as needed for wheezing or shortness of breath. 1 Inhaler 5  . Cholecalciferol (VITAMIN D) 2000 UNITS CAPS Take 1 capsule by mouth daily.    . diclofenac (VOLTAREN) 75 MG EC tablet Take 1 tablet (75 mg total) by mouth 2 (two) times daily. 60 tablet 5  . ferrous sulfate (SLOW FE) 160 (50 Fe) MG TBCR SR tablet Take 1 tablet by mouth daily.    . Multiple Vitamins-Minerals (WOMENS 50+ MULTI VITAMIN/MIN) TABS Take by mouth.    . OMEGA-3 FATTY ACIDS PO Take by mouth.     No current facility-administered medications for this visit.    OBJECTIVE: Filed Vitals:   08/03/15 1039  BP: 149/79  Pulse: 92  Temp: 97.2 F (36.2 C)  Resp: 18     Body mass index is 28.52 kg/(m^2).    ECOG FS:0 - Asymptomatic  General: Well-developed, well-nourished, no acute distress. Eyes: Pink conjunctiva, anicteric sclera. HEENT: Normocephalic, moist mucous membranes, clear oropharnyx. Lungs: Clear to auscultation bilaterally. Heart: Regular rate and rhythm. No rubs, murmurs,  or gallops. Abdomen: Soft, nontender, nondistended. No organomegaly noted, normoactive bowel sounds. Musculoskeletal: No edema, cyanosis, or clubbing. Neuro: Alert, answering all questions appropriately. Cranial nerves grossly intact. Skin: No rashes or petechiae noted. Psych: Normal affect.   LAB RESULTS:  Lab Results  Component Value Date   NA 137 05/21/2015   K 4.8 05/21/2015   CL 97 05/21/2015   CO2 23 05/21/2015   GLUCOSE 85 05/21/2015   BUN 7* 05/21/2015   CREATININE 0.66 05/21/2015   CALCIUM 9.2 05/21/2015   PROT  6.9 05/21/2015   ALBUMIN 4.2 05/21/2015   AST 18 05/21/2015   ALT 18 05/21/2015   ALKPHOS 67 05/21/2015   BILITOT 0.3 05/21/2015   GFRNONAA 95 05/21/2015   GFRAA 109 05/21/2015    Lab Results  Component Value Date   WBC 8.3 05/21/2015   NEUTROABS 5.6 05/21/2015   HCT 38.9 05/21/2015   MCV 92 05/21/2015   PLT 352 05/21/2015     STUDIES: No results found.  ASSESSMENT: Stage IIa thymoma.  PLAN:    1. Thymoma:  Patient was diagnosed in April 2016. Surgical margins reported as negative with only microscopic transcapsular invasion. Patient was also evaluated by radiation oncology at that time and it was determined that no XRT was necessary. Her most recent CT scan in March 2017 was reviewed independently and did not reveal any evidence of recurrence. We will continue routine surveillance in one year with CT scans.  2. Thyroid nodule per CT: 0.7 cm - patient already had thyroid U/S with her ENT physician. 3. Wall thickening sigmoid colon per CT: Patient refuses colonoscopy.  Patient expressed understanding and was in agreement with this plan. She also understands that She can call clinic at any time with any questions, concerns, or complaints.   Mayra Reel, NP   08/03/2015 11:18 AM  Patient was seen and evaluated independently and I agree with the assessment and plan as dictated above.  Lloyd Huger, MD 08/05/2015 7:22 AM

## 2015-08-03 NOTE — Progress Notes (Signed)
Patient does not offer any problems today.  

## 2015-09-18 ENCOUNTER — Ambulatory Visit (INDEPENDENT_AMBULATORY_CARE_PROVIDER_SITE_OTHER): Payer: BLUE CROSS/BLUE SHIELD | Admitting: Internal Medicine

## 2015-09-18 ENCOUNTER — Encounter: Payer: Self-pay | Admitting: Internal Medicine

## 2015-09-18 VITALS — BP 136/86 | HR 82 | Ht 64.0 in | Wt 169.0 lb

## 2015-09-18 DIAGNOSIS — R252 Cramp and spasm: Secondary | ICD-10-CM

## 2015-09-18 DIAGNOSIS — G959 Disease of spinal cord, unspecified: Secondary | ICD-10-CM

## 2015-09-18 DIAGNOSIS — M5136 Other intervertebral disc degeneration, lumbar region: Secondary | ICD-10-CM

## 2015-09-18 DIAGNOSIS — J438 Other emphysema: Secondary | ICD-10-CM | POA: Diagnosis not present

## 2015-09-18 MED ORDER — BACLOFEN 10 MG PO TABS: 5.0000 mg | ORAL_TABLET | Freq: Three times a day (TID) | ORAL | Status: DC

## 2015-09-18 NOTE — Progress Notes (Signed)
Date:  09/18/2015   Name:  Chelsea Santana   DOB:  28-Nov-1950   MRN:  [id number]   Chief Complaint: Gait Problem and Muscle Pain HPI Patient presents with ongoing problems with gait numbness and weakness. She had several evaluations with neurosurgery as well as neurology. She has known degenerative disc disease lumbar spine. She failed epidural steroid injection. She has constant decreased sensation and weakness in the left leg. The degree of weakness tends to fluctuate from day to day. She denies any loss of bowel or bladder function. She is concerned because she's had several falls over the past month, no injuries were sustained. She does use a cane on days when she feels weaker. She's also complaining of muscle spasms and cramps that seem to be random in occurrence. In general though these are increasing in frequency and severity. Recent blood work showed normal electrolytes thyroid function and renal function.   Review of Systems  Constitutional: Negative for fever and chills.  Respiratory: Positive for shortness of breath. Negative for cough, chest tightness and wheezing.   Cardiovascular: Negative for chest pain, palpitations and leg swelling.  Gastrointestinal: Negative for abdominal pain.  Genitourinary: Negative for difficulty urinating.  Musculoskeletal: Positive for myalgias, arthralgias and gait problem.  Neurological: Positive for weakness and numbness (decreased sensation on right side of body). Negative for dizziness, tremors, syncope, speech difficulty and headaches.  Psychiatric/Behavioral: Negative for confusion.    Patient Active Problem List   Diagnosis Date Noted  . Ascending aortic aneurysm ([place]) 05/20/2015  . Coronary artery calcification seen on CT scan 05/20/2015  . Disease of spinal cord ([place]) 03/06/2015  . Degeneration of intervertebral disc of lumbar region 02/17/2015  . H/O abnormal cervical Papanicolaou smear 02/17/2015  . Compulsive tobacco user syndrome  02/17/2015  . Avitaminosis D 02/17/2015  . Thymoma 07/17/2014  . Chronic obstructive pulmonary disease ([place]) 05/25/2014  . Leg weakness 04/14/2013  . Cardiac murmur 08/31/2012  . Essential hypertension 08/31/2012  . CA of skin 08/31/2012    Prior to Admission medications   Medication Sig Start Date End Date Taking? Authorizing Provider  albuterol (PROVENTIL HFA;VENTOLIN HFA) 108 (90 BASE) MCG/ACT inhaler Inhale 2 puffs into the lungs every 6 (six) hours as needed for wheezing or shortness of breath. 03/06/15  Yes [name], MD  Cholecalciferol (VITAMIN D) 2000 UNITS CAPS Take 1 capsule by mouth daily.   Yes Historical Provider, MD  diclofenac (VOLTAREN) 75 MG EC tablet Take 1 tablet (75 mg total) by mouth 2 (two) times daily. 03/06/15  Yes [name], MD  ferrous sulfate (SLOW FE) 160 (50 Fe) MG TBCR SR tablet Take 1 tablet by mouth 2 (two) times a week.    Yes Historical Provider, MD  Multiple Vitamins-Minerals (WOMENS 50+ MULTI VITAMIN/MIN) TABS Take by mouth.   Yes Historical Provider, MD  OMEGA-3 FATTY ACIDS PO Take by mouth.   Yes Historical Provider, MD    Allergies  Allergen Reactions  . Antihistamines, Chlorpheniramine-Type Other (See Comments)    Makes her feel high  . Oxymetazoline Other (See Comments)    Makes her "high"  . Prednisone     lethargy and anger  . Lipoic Acid Other (See Comments)    Numbness in hands    Past Surgical History  Procedure Laterality Date  . Cervical cone biopsy    . Tonsillectomy    . Tubal ligation    . Knee arthroscopy Bilateral   . Thymectomy  07/2014  Social History  Substance Use Topics  . Smoking status: Current Every Day Smoker -- 1.00 packs/day for 48 years    Types: Cigarettes  . Smokeless tobacco: None  . Alcohol Use: No     Medication list has been reviewed and updated.   Physical Exam  Constitutional: She is oriented to person, place, and time. She appears well-developed and well-nourished.  Neck:  Normal range of motion. Neck supple.  Cardiovascular: Normal rate, regular rhythm and normal heart sounds.   Pulmonary/Chest: Effort normal. No accessory muscle usage. No respiratory distress. She has decreased breath sounds. She has no wheezes.  Musculoskeletal: She exhibits no edema.       Right hip: She exhibits decreased range of motion and decreased strength.       Right ankle: She exhibits normal range of motion.       Left ankle: She exhibits normal range of motion.       Lumbar back: She exhibits tenderness. She exhibits no spasm.  Neurological: She is alert and oriented to person, place, and time. She displays no atrophy and no tremor. A sensory deficit (subjective decreased sensation in right leg) is present.  Reflex Scores:      Bicep reflexes are 1+ on the right side and 1+ on the left side.      Patellar reflexes are 0 on the right side and 0 on the left side.      Achilles reflexes are 1+ on the right side and 1+ on the left side. Skin: Skin is warm and dry.  Psychiatric: She has a normal mood and affect. Her behavior is normal. Thought content normal.  Nursing note and vitals reviewed.   BP 130/98 mmHg  Pulse 82  Ht 5\' 4"  (1.626 m)  Wt 169 lb (76.658 kg)  BMI 28.99 kg/m2  Assessment and Plan: 1. Degeneration of intervertebral disc of lumbar region Persistent symptoms with fluctuating severity with no evidence of fibromyalgia Strongly recommend cane for safety Handicapped placard application signed  2. Disease of spinal cord [facility]) Encourage follow [facility] at [facility] - pt is adamant not to return there so will contact Neurosurgery in [place] again to have records sent - baclofen (LIORESAL) 10 MG tablet; Take 0.5 tablets (5 mg total) by mouth 3 (three) times daily.  Dispense: 30 each; Refill: 0  3. Muscle cramps Will try baclofen low dose  4. Other emphysema ([place]) Stable; continues to smoke at this time with no interest in quitting   Halina Maidens, MD [facility]  09/18/2015

## 2015-09-21 ENCOUNTER — Telehealth: Payer: Self-pay

## 2015-09-21 NOTE — Telephone Encounter (Signed)
Patient Left VM that she has BP on new BP wrist reader says 185/104. Advised to go to [employer] if high tomorrow and then they will advise if need to be seen. I also instructed to call 911 if SOB, Chest Pains, Swelling or Headache arises.

## 2015-10-03 ENCOUNTER — Ambulatory Visit (INDEPENDENT_AMBULATORY_CARE_PROVIDER_SITE_OTHER): Payer: BLUE CROSS/BLUE SHIELD | Admitting: Internal Medicine

## 2015-10-03 ENCOUNTER — Encounter: Payer: Self-pay | Admitting: Internal Medicine

## 2015-10-03 VITALS — BP 138/80 | HR 98 | Ht 64.0 in | Wt 168.0 lb

## 2015-10-03 DIAGNOSIS — R079 Chest pain, unspecified: Secondary | ICD-10-CM

## 2015-10-03 DIAGNOSIS — I2510 Atherosclerotic heart disease of native coronary artery without angina pectoris: Secondary | ICD-10-CM | POA: Diagnosis not present

## 2015-10-03 NOTE — Progress Notes (Signed)
Date:  10/03/2015   Name:  Chelsea Santana   DOB:  1950/12/25   MRN:  [id number]   Chief Complaint: Chest Pain Patient had onset of substernal chest pain last evening when she declined for sleep. She had pain in her left shoulder as well. She was able to move and change position with minor relief. Altogether chest pain lasted about 5 minutes and was described as 10 over 10. There was no associated sweats shortness of breath nausea vomiting or jaw pain. She was able to go to sleep and slept restfully all night. Today she feels fine but wanted to be evaluated. Of note she continues to smoke a pack of cigarettes per day she does not take aspirin therapy. Review of CT scans from October 2016 and March 2017 revealed coronary artery calcifications of the LAD and right coronary artery..  CT done at [facility] 01/2015: CT Chest without contrast  Indication: J98.59 Other diseases of mediastinum, not elsewhere classified, thymoma  Compare: None  Technique: Volumetric non-contrast chest CT acquisition was performed from the lower neck to the adrenal glands. 1.25 mm axial, 5 mm axial, 3 mm coronal, and 3 mm sagittal reconstructions were performed. 3D axial maximum intensity projection images (MIPS) were reconstructed to facilitate lung nodule detection.  Findings: Heart size is upper limits of normal. There is no pericardial effusion. Mild to moderate coronary calcifications within the LAD and right coronary artery. The great vessels are normal in course and caliber. Mild atherosclerotic calcifications at the aortic arch and descending thoracic aorta. Thyroid gland and esophagus are unremarkable. Minimal soft tissue in the anterior mediastinum status post resection of thymoma it is likely postsurgical. No masslike soft tissue to suggest recurrence. Small scattered mediastinal lymph nodes are unchanged.   The central airways are patent. No suspicious pulmonary nodules or masses. No pleural  effusions.  The visualized upper abdomen is unremarkable. Diffuse osteopenia. No aggressive osseous lesions.  Impression: Postoperative changes status post resection of thymoma. Otherwise stable.  Electronically Reviewed by: Jola Baptist, MD Electronically Reviewed on: 01/29/2015 11:22 AM     Review of Systems  Constitutional: Negative for fever, chills and fatigue.  Respiratory: Negative for cough, chest tightness and wheezing.   Cardiovascular: Negative for chest pain and palpitations.  Gastrointestinal: Negative for abdominal pain.  Genitourinary: Negative for dysuria.  Neurological: Negative for dizziness, syncope and headaches.  Hematological: Negative for adenopathy.    Patient Active Problem List   Diagnosis Date Noted  . Ascending aortic aneurysm ([place]) 05/20/2015  . Coronary artery calcification seen on CT scan 05/20/2015  . Disease of spinal cord ([place]) 03/06/2015  . Degeneration of intervertebral disc of lumbar region 02/17/2015  . H/O abnormal cervical Papanicolaou smear 02/17/2015  . Compulsive tobacco user syndrome 02/17/2015  . Avitaminosis D 02/17/2015  . Thymoma 07/17/2014  . Chronic obstructive pulmonary disease ([place]) 05/25/2014  . Leg weakness 04/14/2013  . Cardiac murmur 08/31/2012  . Essential hypertension 08/31/2012  . CA of skin 08/31/2012    Prior to Admission medications   Medication Sig Start Date End Date Taking? Authorizing Provider  albuterol (PROVENTIL HFA;VENTOLIN HFA) 108 (90 BASE) MCG/ACT inhaler Inhale 2 puffs into the lungs every 6 (six) hours as needed for wheezing or shortness of breath. 03/06/15  Yes [name], MD  baclofen (LIORESAL) 10 MG tablet Take 0.5 tablets (5 mg total) by mouth 3 (three) times daily. 09/18/15  Yes [name], MD  Cholecalciferol (VITAMIN D) 2000 UNITS CAPS Take 1 capsule  by mouth daily.   Yes Historical Provider, MD  diclofenac (VOLTAREN) 75 MG EC tablet Take 1 tablet (75 mg total) by mouth 2 (two)  times daily. 03/06/15  Yes [name], MD  ferrous sulfate (SLOW FE) 160 (50 Fe) MG TBCR SR tablet Take 1 tablet by mouth 2 (two) times a week.    Yes Historical Provider, MD  Multiple Vitamins-Minerals (WOMENS 50+ MULTI VITAMIN/MIN) TABS Take by mouth.   Yes Historical Provider, MD  OMEGA-3 FATTY ACIDS PO Take by mouth.   Yes Historical Provider, MD    Allergies  Allergen Reactions  . Antihistamines, Chlorpheniramine-Type Other (See Comments)    Makes her feel high  . Oxymetazoline Other (See Comments)    Makes her "high"  . Prednisone     lethargy and anger  . Lipoic Acid Other (See Comments)    Numbness in hands    Past Surgical History  Procedure Laterality Date  . Cervical cone biopsy    . Tonsillectomy    . Tubal ligation    . Knee arthroscopy Bilateral   . Thymectomy  07/2014    Social History  Substance Use Topics  . Smoking status: Current Every Day Smoker -- 1.00 packs/day for 48 years    Types: Cigarettes  . Smokeless tobacco: None  . Alcohol Use: No     Medication list has been reviewed and updated.   Physical Exam  Constitutional: She is oriented to person, place, and time. She appears well-developed. No distress.  HENT:  Head: Normocephalic and atraumatic.  Neck: Carotid bruit is not present.  Cardiovascular: Normal rate, regular rhythm and normal heart sounds.   Pulmonary/Chest: Effort normal. No respiratory distress. She has decreased breath sounds. She has no wheezes.  Musculoskeletal: Normal range of motion.  Neurological: She is alert and oriented to person, place, and time.  Skin: Skin is warm and dry. No rash noted.  Psychiatric: Her behavior is normal. Thought content normal. Her mood appears anxious.    BP 150/110 mmHg  Pulse 98  Ht 5\' 4"  (1.626 m)  Wt 168 lb (76.204 kg)  BMI 28.82 kg/m2  Assessment and Plan: 1. Chest pain, unspecified chest pain type Suspicious for coronary source given smoking hx and evidence of coronary  calcification on CT scans If she has recurrent chest pain she is instructed to call 911 - EKG 12-Lead - SR with LAE (no change from previous)  2. Coronary artery calcification seen on CT scan Begin Aspirin 81 mg daily - Ambulatory referral to Cardiology  Halina Maidens, MD [facility]  10/03/2015

## 2015-10-08 ENCOUNTER — Encounter (INDEPENDENT_AMBULATORY_CARE_PROVIDER_SITE_OTHER): Payer: Self-pay

## 2015-10-08 ENCOUNTER — Other Ambulatory Visit: Payer: Self-pay | Admitting: Internal Medicine

## 2015-10-08 ENCOUNTER — Ambulatory Visit (INDEPENDENT_AMBULATORY_CARE_PROVIDER_SITE_OTHER): Payer: BLUE CROSS/BLUE SHIELD | Admitting: Cardiovascular Disease

## 2015-10-08 ENCOUNTER — Encounter: Payer: Self-pay | Admitting: Cardiovascular Disease

## 2015-10-08 VITALS — BP 160/90 | HR 88 | Ht 64.0 in | Wt 170.0 lb

## 2015-10-08 DIAGNOSIS — I10 Essential (primary) hypertension: Secondary | ICD-10-CM

## 2015-10-08 DIAGNOSIS — R011 Cardiac murmur, unspecified: Secondary | ICD-10-CM

## 2015-10-08 DIAGNOSIS — I7121 Aneurysm of the ascending aorta, without rupture: Secondary | ICD-10-CM

## 2015-10-08 DIAGNOSIS — I712 Thoracic aortic aneurysm, without rupture: Secondary | ICD-10-CM

## 2015-10-08 DIAGNOSIS — I2510 Atherosclerotic heart disease of native coronary artery without angina pectoris: Secondary | ICD-10-CM | POA: Diagnosis not present

## 2015-10-08 DIAGNOSIS — F17200 Nicotine dependence, unspecified, uncomplicated: Secondary | ICD-10-CM

## 2015-10-08 DIAGNOSIS — I700 Atherosclerosis of aorta: Secondary | ICD-10-CM

## 2015-10-08 DIAGNOSIS — R079 Chest pain, unspecified: Secondary | ICD-10-CM | POA: Insufficient documentation

## 2015-10-08 MED ORDER — LOSARTAN POTASSIUM 50 MG PO TABS: 50.0000 mg | ORAL_TABLET | Freq: Every day | ORAL | Status: DC

## 2015-10-08 NOTE — Progress Notes (Signed)
Patient ID: Chelsea Santana, female   DOB: 29-Dec-1950, 65 y.o.   MRN: [id number] Cardiology Office Note  Date:  10/08/2015   ID:  Chelsea Santana, DOB 1951/01/08, MRN [id number]  PCP:  Halina Maidens, MD   Chief Complaint  Patient presents with  . other    Discuss CT scan. Meds reviewed verbally with pt.    HPI:  [name] is a 65 year old woman with long history of smoking for more than 40 years, coronary artery disease, PAD, hypertension who presents by referral from Dr. Army Melia for consultation of recent episodes of chest pain and coronary artery disease on CT scan.  Patient reports that last Tuesday approximately 6 days ago she ate some "Swiss rolls" before going to bed. Approximately one hour later she had discomfort in her upper epigastric, subxiphoid area. Symptoms resolved without intervention. 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 Moves bricks, piece of wood, makes a table. Puts things on top. Also takes care of 2 acres, push Mows for one hour, except branches, likes to keep her garden looking pretty, does some hedging. Denies having any significant shortness of breath or chest pain when she is active in her garden. Does all of her own shopping, denies having any symptoms of chest pain or shortness of breath with activity In general she reports that she feels well apart from some severe cramping in her feet and legs, recently started on baclofen.   Also reports having some numbness feeling right buttock region extending down her right leg No regular exercise program, Smokes one pack per day Does not want Chantix, has a nicotine inhaler Does not want a statin , reports having myalgias and in the setting of her recent cramping, does not want anything that will make this worse  She does have  significant stress, her son and his girlfriend are living with them, they are having financial issues  CT scan of the chest images reviewed with her in detail in the office  EKG on today's visit shows normal sinus rhythm with rate 88 bpm, no significant ST or T-wave changes   PMH:   has a past medical history of Cancer ([place]).  PSH:    Past Surgical History  Procedure Laterality Date  . Cervical cone biopsy    . Tonsillectomy    . Tubal ligation    . Knee arthroscopy Bilateral   . Thymectomy  07/2014    Current Outpatient Prescriptions  Medication Sig Dispense Refill  . albuterol (PROVENTIL HFA;VENTOLIN HFA) 108 (90 BASE) MCG/ACT inhaler Inhale 2 puffs into the lungs every 6 (six) hours as needed for wheezing or shortness of breath. 1 Inhaler 5  . aspirin 81 MG tablet Take 81 mg by mouth daily.    . baclofen (LIORESAL) 10 MG tablet Take 0.5 tablets (5 mg total) by mouth 3 (three) times daily. 30 each 0  . Cholecalciferol (VITAMIN D) 2000 UNITS CAPS Take 1 capsule by mouth daily.    . diclofenac (VOLTAREN) 75 MG EC tablet Take 1 tablet (75 mg total) by mouth 2 (two) times daily. 60 tablet 5  . ferrous sulfate (SLOW FE) 160 (50 Fe) MG TBCR SR tablet Take 1 tablet by mouth 2 (two) times a week.     . Multiple Vitamins-Minerals (WOMENS 50+ MULTI VITAMIN/MIN)  TABS Take by mouth.    . OMEGA-3 FATTY ACIDS PO Take by mouth.    . losartan (COZAAR) 50 MG tablet Take 1 tablet (50 mg total) by mouth daily. 30 tablet 11   No current facility-administered medications for this visit.     Allergies:   Antihistamines, chlorpheniramine-type; Oxymetazoline; Prednisone; and Lipoic acid   Social History:  The patient  reports that she has been smoking Cigarettes.  She has a 48 pack-year smoking history. She does not have any smokeless tobacco history on file. She reports that she does not drink alcohol or use illicit drugs.   Family History:   family history includes Bipolar disorder in her son; Lung  cancer in her father.    Review of Systems: Review of Systems  Constitutional: Negative.   Respiratory: Negative.   Cardiovascular: Negative.   Gastrointestinal: Negative.   Musculoskeletal:       Leg pain on the right  Neurological: Negative.   Psychiatric/Behavioral: Negative.   All other systems reviewed and are negative.    PHYSICAL EXAM: VS:  BP 160/90 mmHg  Pulse 88  Ht 5\' 4"  (1.626 m)  Wt 170 lb (77.111 kg)  BMI 29.17 kg/m2 , BMI Body mass index is 29.17 kg/(m^2). GEN: Well nourished, well developed, in no acute distress HEENT: normal Neck: no JVD, carotid bruits, or masses Cardiac: RRR; no murmurs, rubs, or gallops,no edema  Respiratory:  clear to auscultation bilaterally, normal work of breathing GI: soft, nontender, nondistended, + BS MS: no deformity or atrophy Skin: warm and dry, no rash Neuro:  Strength and sensation are intact Psych: euthymic mood, full affect    Recent Labs: 05/21/2015: ALT 18; BUN 7*; Creatinine, Ser 0.66; Platelets 352; Potassium 4.8; Sodium 137; TSH 0.854    Lipid Panel Lab Results  Component Value Date   CHOL 201* 05/21/2015   HDL 68 05/21/2015   LDLCALC 120* 05/21/2015   TRIG 63 05/21/2015      Wt Readings from Last 3 Encounters:  10/08/15 170 lb (77.111 kg)  10/03/15 168 lb (76.204 kg)  09/18/15 169 lb (76.658 kg)       ASSESSMENT AND PLAN:  Essential hypertension - Plan: EKG 12-Lead Blood pressure is elevated on today's visit. She reports it is also high at home typically [id number] systolic Recommended she start losartan 50 mg daily. She only wants to start 25 mg daily Recommended she increase the dose if systolic pressures continue to run high  Coronary artery calcification seen on CT scan At least mild diffuse coronary calcifications noted in her LAD and RCA Images reviewed with her in detail Recommended smoking cessation, aggressive cholesterol management. She does not want a statin, does not want any modalities  to help her quit smoking Long discussion concerning anginal symptoms and what to look for  Ascending aortic aneurysm (HCC)  3.9 cm ascending aorta, this can be watched periodically with echocardiogram Currently mild dilation  Compulsive tobacco user syndrome We have encouraged her to continue to work on weaning her cigarettes and smoking cessation. She will continue to work on this and does not want any assistance with chantix.   Aortic atherosclerosis (HCC) Diffuse aortic atherosclerosis including the arch, descending aorta Images shown to her in detail Recommended smoking cessation, aggressive cholesterol management She will try red [place] rice. She does not want a statin  Chest pain, unspecified chest pain type Atypical chest pain, likely GI in nature Otherwise very active at baseline with no reproducible anginal symptoms As detailed  above, discussed her various risk factors and the need for change including smoking, cholesterol Discussed anginal symptoms to watch for If symptoms get worse, more frequent, could perform a stress test   Total encounter time more than 45 minutes  Greater than 50% was spent in counseling and coordination of care with the patient    Disposition:   F/U  as needed   Orders Placed This Encounter  Procedures  . EKG 12-Lead     Signed, Esmond Plants, M.D., Ph.D. 10/08/2015  [place], [place]

## 2015-10-08 NOTE — Patient Instructions (Addendum)
For any chest pain or bad short of breath with exertion, Call the office  Try to work on weaning down on the smoking Call if you would like chantix  Please monitor your blood pressure Goal is <140 on the top  <90 on the bottom number    Medication Instructions:   Start losartan 1/2 pill daily for blood pressure  Red yeast rice for cholesterol  Labwork:  No new labs  Testing/Procedures:  No new testing,   Follow-Up: It was a pleasure seeing you in the office today. Please call us if you have new issues that need to be addressed before your next appt.  [phone]  Your physician wants you to follow-up in:  As needed  If you need a refill on your cardiac medications before your next appointment, please call your pharmacy.       Steps to Quit Smoking  Smoking tobacco can be harmful to your health and can affect almost every organ in your body. Smoking puts you, and those around you, at risk for developing many serious chronic diseases. Quitting smoking is difficult, but it is one of the best things that you can do for your health. It is never too late to quit. WHAT ARE THE BENEFITS OF QUITTING SMOKING? When you quit smoking, you lower your risk of developing serious diseases and conditions, such as:  Lung cancer or lung disease, such as COPD.  Heart disease.  Stroke.  Heart attack.  Infertility.  Osteoporosis and bone fractures. Additionally, symptoms such as coughing, wheezing, and shortness of breath may get better when you quit. You may also find that you get sick less often because your body is stronger at fighting off colds and infections. If you are pregnant, quitting smoking can help to reduce your chances of having a baby of low birth weight. HOW DO I GET READY TO QUIT? When you decide to quit smoking, create a plan to make sure that you are successful. Before you quit:  Pick a date to quit. Set a date within the next two weeks to give you time to  prepare.  Write down the reasons why you are quitting. Keep this list in places where you will see it often, such as on your bathroom mirror or in your car or wallet.  Identify the people, places, things, and activities that make you want to smoke (triggers) and avoid them. Make sure to take these actions:  Throw away all cigarettes at home, at work, and in your car.  Throw away smoking accessories, such as Scientist, research (medical).  Clean your car and make sure to empty the ashtray.  Clean your home, including curtains and carpets.  Tell your family, friends, and coworkers that you are quitting. Support from your loved ones can make quitting easier.  Talk with your health care provider about your options for quitting smoking.  Find out what treatment options are covered by your health insurance. WHAT STRATEGIES CAN I USE TO QUIT SMOKING?  Talk with your healthcare provider about different strategies to quit smoking. Some strategies include:  Quitting smoking altogether instead of gradually lessening how much you smoke over a period of time. Research shows that quitting "cold [place]" is more successful than gradually quitting.  Attending in-person counseling to help you build problem-solving skills. You are more likely to have success in quitting if you attend several counseling sessions. Even short sessions of 10 minutes can be effective.  Finding resources and support systems that can help  you to quit smoking and remain smoke-free after you quit. These resources are most helpful when you use them often. They can include:  Online chats with a Social worker.  Telephone quitlines.  Printed Furniture conservator/restorer.  Support groups or group counseling.  Text messaging programs.  Mobile phone applications.  Taking medicines to help you quit smoking. (If you are pregnant or breastfeeding, talk with your health care provider first.) Some medicines contain nicotine and some do not. Both types  of medicines help with cravings, but the medicines that include nicotine help to relieve withdrawal symptoms. Your health care provider may recommend:  Nicotine patches, gum, or lozenges.  Nicotine inhalers or sprays.  Non-nicotine medicine that is taken by mouth. Talk with your health care provider about combining strategies, such as taking medicines while you are also receiving in-person counseling. Using these two strategies together makes you more likely to succeed in quitting than if you used either strategy on its own. If you are pregnant or breastfeeding, talk with your health care provider about finding counseling or other support strategies to quit smoking. Do not take medicine to help you quit smoking unless told to do so by your health care provider. WHAT THINGS CAN I DO TO MAKE IT EASIER TO QUIT? Quitting smoking might feel overwhelming at first, but there is a lot that you can do to make it easier. Take these important actions:  Reach out to your family and friends and ask that they support and encourage you during this time. Call telephone quitlines, reach out to support groups, or work with a counselor for support.  Ask people who smoke to avoid smoking around you.  Avoid places that trigger you to smoke, such as bars, parties, or smoke-break areas at work.  Spend time around people who do not smoke.  Lessen stress in your life, because stress can be a smoking trigger for some people. To lessen stress, try:  Exercising regularly.  Deep-breathing exercises.  Yoga.  Meditating.  Performing a body scan. This involves closing your eyes, scanning your body from head to toe, and noticing which parts of your body are particularly tense. Purposefully relax the muscles in those areas.  Download or purchase mobile phone or tablet apps (applications) that can help you stick to your quit plan by providing reminders, tips, and encouragement. There are many free apps, such as  QuitGuide from the [employer] Office manager for Disease Control and Prevention). You can find other support for quitting smoking (smoking cessation) through smokefree.gov and other websites. HOW WILL I FEEL WHEN I QUIT SMOKING? Within the first 24 hours of quitting smoking, you may start to feel some withdrawal symptoms. These symptoms are usually most noticeable 2-3 days after quitting, but they usually do not last beyond 2-3 weeks. Changes or symptoms that you might experience include:  Mood swings.  Restlessness, anxiety, or irritation.  Difficulty concentrating.  Dizziness.  Strong cravings for sugary foods in addition to nicotine.  Mild weight gain.  Constipation.  Nausea.  Coughing or a sore throat.  Changes in how your medicines work in your body.  A depressed mood.  Difficulty sleeping (insomnia). After the first 2-3 weeks of quitting, you may start to notice more positive results, such as:  Improved sense of smell and taste.  Decreased coughing and sore throat.  Slower heart rate.  Lower blood pressure.  Clearer skin.  The ability to breathe more easily.  Fewer sick days. Quitting smoking is very challenging for most people. Do  not get discouraged if you are not successful the first time. Some people need to make many attempts to quit before they achieve long-term success. Do your best to stick to your quit plan, and talk with your health care provider if you have any questions or concerns.   This information is not intended to replace advice given to you by your health care provider. Make sure you discuss any questions you have with your health care provider.   Document Released: 03/18/2001 Document Revised: 08/08/2014 Document Reviewed: 08/08/2014 Elsevier Interactive Patient Education 2016 [employer]. Smoking Hazards Smoking cigarettes is extremely bad for your health. Tobacco smoke has over 200 known poisons in it. It contains the poisonous gases nitrogen oxide  and carbon monoxide. There are over 60 chemicals in tobacco smoke that cause cancer. Some of the chemicals found in cigarette smoke include:   Cyanide.   Benzene.   Formaldehyde.   Methanol (wood alcohol).   Acetylene (fuel used in welding torches).   Ammonia.  Even smoking lightly shortens your life expectancy by several years. You can greatly reduce the risk of medical problems for you and your family by stopping now. Smoking is the most preventable cause of death and disease in our society. Within days of quitting smoking, your circulation improves, you decrease the risk of having a heart attack, and your lung capacity improves. There may be some increased phlegm in the first few days after quitting, and it may take months for your lungs to clear up completely. Quitting for 10 years reduces your risk of developing lung cancer to almost that of a nonsmoker.  WHAT ARE THE RISKS OF SMOKING? Cigarette smokers have an increased risk of many serious medical problems, including:  Lung cancer.   Lung disease (such as pneumonia, bronchitis, and emphysema).   Heart attack and chest pain due to the heart not getting enough oxygen (angina).   Heart disease and peripheral blood vessel disease.   Hypertension.   Stroke.   Oral cancer (cancer of the lip, mouth, or voice box).   Bladder cancer.   Pancreatic cancer.   Cervical cancer.   Pregnancy complications, including premature birth.   Stillbirths and smaller newborn babies, birth defects, and genetic damage to sperm.   Early menopause.   Lower estrogen level for women.   Infertility.   Facial wrinkles.   Blindness.   Increased risk of broken bones (fractures).   Senile dementia.   Stomach ulcers and internal bleeding.   Delayed wound healing and increased risk of complications during surgery. Because of secondhand smoke exposure, children of smokers have an increased risk of the following:    Sudden infant death syndrome (SIDS).   Respiratory infections.   Lung cancer.   Heart disease.   Ear infections.  WHY IS SMOKING ADDICTIVE? Nicotine is the chemical agent in tobacco that is capable of causing addiction or dependence. When you smoke and inhale, nicotine is absorbed rapidly into the bloodstream through your lungs. Both inhaled and noninhaled nicotine may be addictive.  WHAT ARE THE BENEFITS OF QUITTING?  There are many health benefits to quitting smoking. Some are:   The likelihood of developing cancer and heart disease decreases. Health improvements are seen almost immediately.   Blood pressure, pulse rate, and breathing patterns start returning to normal soon after quitting.   People who quit may see an improvement in their overall quality of life.  HOW DO YOU QUIT SMOKING? Smoking is an addiction with both physical and psychological  effects, and longtime habits can be hard to change. Your health care provider can recommend:  Programs and community resources, which may include group support, education, or therapy.  Replacement products, such as patches, gum, and nasal sprays. Use these products only as directed. Do not replace cigarette smoking with electronic cigarettes (commonly called e-cigarettes). The safety of e-cigarettes is unknown, and some may contain harmful chemicals. FOR MORE INFORMATION  American Lung Association: www.lung.org  American Cancer Society: www.cancer.org   This information is not intended to replace advice given to you by your health care provider. Make sure you discuss any questions you have with your health care provider.   Document Released: 05/01/2004 Document Revised: 01/12/2013 Document Reviewed: 09/13/2012 Elsevier Interactive Patient Education 2016 [place] WHAT IS SECONDHAND SMOKE? Secondhand smoke is smoke that comes from burning tobacco. It could be the smoke from a cigarette, a pipe, or a  cigar. Even if you are not the one smoking, secondhand smoke exposes you to the dangers of smoking. This is called involuntary, or passive, smoking. There are two types of secondhand smoke:  Sidestream smoke is the smoke that comes off the lighted end of a cigarette, pipe, or cigar.  This type of smoke has the highest amount of cancer-causing agents (carcinogens).  The particles in sidestream smoke are smaller. They get into your lungs more easily.  Mainstream smoke is the smoke that is exhaled by a person who is smoking.  This type of smoke is also dangerous to your health. HOW CAN SECONDHAND SMOKE AFFECT MY HEALTH? Studies show that there is no safe level of secondhand smoke. This smoke contains thousands of chemicals. At least 19 of them are known to cause cancer. Secondhand smoke can also cause many other health problems. It has been linked to:  Lung cancer.  Cancer of the voice box (larynx) or throat.  Cancer of the sinuses.  Brain cancer.  Bladder cancer.  Stomach cancer.  Breast cancer.  White blood cell cancers (lymphoma and leukemia).  Brain and liver tumors in children.  Heart disease and stroke in adults.  Pregnancy loss (miscarriage).  Diseases in children, such as:  Asthma.  Lung infections.  Ear infections.  Sudden infant death syndrome (SIDS).  Slow growth. WHERE CAN I BE AT RISK FOR EXPOSURE TO SECONDHAND SMOKE?   For adults, the workplace is the main source of exposure to secondhand smoke.  Your workplace should have a policy separating smoking areas from nonsmoking areas.  Smoking areas should have a system for ventilating and cleaning the air.  For children, the home may be the most dangerous place for exposure to secondhand smoke.  Children who live in apartment buildings may be at risk from smoke drifting from hallways or other people's homes.  For everyone, many public places are possible sources of exposure to secondhand  smoke.  These places include restaurants, shopping centers, and parks. HOW CAN I REDUCE MY RISK FOR EXPOSURE TO SECONDHAND SMOKE? The most important thing you can do is not smoke. Discourage family members from smoking. Other ways to reduce exposure for you and your family include the following:  Keep your home smoke free.  Make sure your child care providers do not smoke.  Warn your child about the dangers of smoking and secondhand smoke.  Do not allow smoking in your car. When someone smokes in a car, all the damaging chemicals from the smoke are confined in a small area.  Avoid public places where smoking is  allowed.   This information is not intended to replace advice given to you by your health care provider. Make sure you discuss any questions you have with your health care provider.   Document Released: 05/01/2004 Document Revised: 04/14/2014 Document Reviewed: 07/08/2013 Elsevier Interactive Patient Education [employer].

## 2015-10-31 ENCOUNTER — Ambulatory Visit: Payer: BLUE CROSS/BLUE SHIELD | Admitting: Cardiology

## 2015-11-12 ENCOUNTER — Telehealth: Payer: Self-pay

## 2015-11-12 ENCOUNTER — Other Ambulatory Visit: Payer: Self-pay | Admitting: Internal Medicine

## 2015-11-12 MED ORDER — BACLOFEN 10 MG PO TABS: 10.0000 mg | ORAL_TABLET | Freq: Three times a day (TID) | ORAL | 3 refills | Status: DC

## 2015-11-12 NOTE — Telephone Encounter (Signed)
Wants 10 mg Baclofen 5 mg is not working.

## 2015-12-27 ENCOUNTER — Other Ambulatory Visit: Payer: Self-pay | Admitting: Otolaryngology

## 2015-12-27 DIAGNOSIS — E041 Nontoxic single thyroid nodule: Secondary | ICD-10-CM

## 2016-02-14 ENCOUNTER — Other Ambulatory Visit: Payer: Self-pay | Admitting: Internal Medicine

## 2016-02-14 DIAGNOSIS — M5136 Other intervertebral disc degeneration, lumbar region: Secondary | ICD-10-CM

## 2016-03-03 ENCOUNTER — Telehealth: Payer: Self-pay | Admitting: *Deleted

## 2016-03-03 NOTE — Telephone Encounter (Signed)
Chelsea Santana called and she is interested in Exercising in the [facility] [place] [facility] ([facility]). I sent a referral request to Dr. Grayland Ormond today.

## 2016-03-04 ENCOUNTER — Other Ambulatory Visit: Payer: Self-pay | Admitting: *Deleted

## 2016-03-04 DIAGNOSIS — D4989 Neoplasm of unspecified behavior of other specified sites: Secondary | ICD-10-CM

## 2016-03-04 DIAGNOSIS — D150 Benign neoplasm of thymus: Principal | ICD-10-CM

## 2016-03-05 ENCOUNTER — Ambulatory Visit (INDEPENDENT_AMBULATORY_CARE_PROVIDER_SITE_OTHER): Payer: BLUE CROSS/BLUE SHIELD | Admitting: Internal Medicine

## 2016-03-05 ENCOUNTER — Encounter: Payer: Self-pay | Admitting: Internal Medicine

## 2016-03-05 VITALS — BP 160/92 | HR 97 | Resp 16 | Ht 64.0 in | Wt 176.8 lb

## 2016-03-05 DIAGNOSIS — M722 Plantar fascial fibromatosis: Secondary | ICD-10-CM | POA: Diagnosis not present

## 2016-03-05 DIAGNOSIS — I7121 Aneurysm of the ascending aorta, without rupture: Secondary | ICD-10-CM

## 2016-03-05 DIAGNOSIS — I712 Thoracic aortic aneurysm, without rupture: Secondary | ICD-10-CM

## 2016-03-05 DIAGNOSIS — I10 Essential (primary) hypertension: Secondary | ICD-10-CM | POA: Diagnosis not present

## 2016-03-05 MED ORDER — DICLOFENAC SODIUM 1.5 % TD SOLN: 1.0000 mL | Freq: Three times a day (TID) | TRANSDERMAL | 5 refills | Status: DC

## 2016-03-05 NOTE — Patient Instructions (Signed)
Plantar Fasciitis Plantar fasciitis is a painful foot condition that affects the heel. It occurs when the band of tissue that connects the toes to the heel bone (plantar fascia) becomes irritated. This can happen after exercising too much or doing other repetitive activities (overuse injury). The pain from plantar fasciitis can range from mild irritation to severe pain that makes it difficult for you to walk or move. The pain is usually worse in the morning or after you have been sitting or lying down for a while. CAUSES This condition may be caused by:  Standing for long periods of time.  Wearing shoes that do not fit.  Doing high-impact activities, including running, aerobics, and ballet.  Being overweight.  Having an abnormal way of walking (gait).  Having tight calf muscles.  Having high arches in your feet.  Starting a new athletic activity. SYMPTOMS The main symptom of this condition is heel pain. Other symptoms include:  Pain that gets worse after activity or exercise.  Pain that is worse in the morning or after resting.  Pain that goes away after you walk for a few minutes. DIAGNOSIS This condition may be diagnosed based on your signs and symptoms. Your health care provider will also do a physical exam to check for:  A tender area on the bottom of your foot.  A high arch in your foot.  Pain when you move your foot.  Difficulty moving your foot. You may also need to have imaging studies to confirm the diagnosis. These can include:  X-rays.  Ultrasound.  MRI. TREATMENT  Treatment for plantar fasciitis depends on the severity of the condition. Your treatment may include:  Rest, ice, and over-the-counter pain medicines to manage your pain.  Exercises to stretch your calves and your plantar fascia.  A splint that holds your foot in a stretched, upward position while you sleep (night splint).  Physical therapy to relieve symptoms and prevent problems in the  future.  Cortisone injections to relieve severe pain.  Extracorporeal shock wave therapy (ESWT) to stimulate damaged plantar fascia with electrical impulses. It is often used as a last resort before surgery.  Surgery, if other treatments have not worked after 12 months. HOME CARE INSTRUCTIONS  Take medicines only as directed by your health care provider.  Avoid activities that cause pain.  Roll the bottom of your foot over a bag of ice or a bottle of cold water. Do this for 20 minutes, 3-4 times a day.  Perform simple stretches as directed by your health care provider.  Try wearing athletic shoes with air-sole or gel-sole cushions or soft shoe inserts.  Wear a night splint while sleeping, if directed by your health care provider.  Keep all follow-up appointments with your health care provider. PREVENTION   Do not perform exercises or activities that cause heel pain.  Consider finding low-impact activities if you continue to have problems.  Lose weight if you need to. The best way to prevent plantar fasciitis is to avoid the activities that aggravate your plantar fascia. SEEK MEDICAL CARE IF:  Your symptoms do not go away after treatment with home care measures.  Your pain gets worse.  Your pain affects your ability to move or do your daily activities. This information is not intended to replace advice given to you by your health care provider. Make sure you discuss any questions you have with your health care provider. Document Released: 12/17/2000 Document Revised: 07/16/2015 Document Reviewed: 02/01/2014 Elsevier Interactive Patient Education    2017 Elsevier Inc[employer]  

## 2016-03-05 NOTE — Progress Notes (Signed)
Date:  03/05/2016   Name:  Chelsea Santana   DOB:  [birth year]   MRN:  [id number]   Chief Complaint: Ankle Pain (2 weeks) Ankle Pain   There was no injury mechanism. The pain is present in the right foot. The quality of the pain is described as aching and shooting. The pain is mild. The pain has been fluctuating since onset. Associated symptoms include an inability to bear weight and numbness. The symptoms are aggravated by weight bearing.   Hypertension - She remains very reluctant to take medication, despite persistent high readings, tobacco use and an AAA that is borderline in size.  She saw Dr. Rockey Situ in July - he recommended cozaar 25 mg which she never took.  She maintains that her BP at home in the AM is 139/85 but does get higher later in the day.  She feels that this is close to the goal of 130/80.      Review of Systems  Constitutional: Negative for chills, fatigue and fever.  Respiratory: Negative for chest tightness and shortness of breath.   Cardiovascular: Negative for chest pain, palpitations and leg swelling.  Musculoskeletal: Positive for arthralgias and gait problem.  Neurological: Positive for numbness.    Patient Active Problem List   Diagnosis Date Noted  . Ascending aortic aneurysm ([place]) 05/20/2015  . Coronary artery calcification seen on CT scan 05/20/2015  . Disease of spinal cord ([place]) 03/06/2015  . Degeneration of intervertebral disc of lumbar region 02/17/2015  . H/O abnormal cervical Papanicolaou smear 02/17/2015  . Compulsive tobacco user syndrome 02/17/2015  . Avitaminosis D 02/17/2015  . Thymoma 07/17/2014  . Chronic obstructive pulmonary disease ([place]) 05/25/2014  . Leg weakness 04/14/2013  . Cardiac murmur 08/31/2012  . Essential hypertension 08/31/2012  . CA of skin 08/31/2012    Prior to Admission medications   Medication Sig Start Date End Date Taking? Authorizing Provider  albuterol (PROVENTIL HFA;VENTOLIN HFA) 108 (90 BASE) MCG/ACT inhaler  Inhale 2 puffs into the lungs every 6 (six) hours as needed for wheezing or shortness of breath. 03/06/15  Yes [name], MD  baclofen (LIORESAL) 10 MG tablet Take 1 tablet (10 mg total) by mouth 3 (three) times daily. 11/12/15  Yes [name], MD  Cholecalciferol (VITAMIN D) 2000 UNITS CAPS Take 1 capsule by mouth daily.   Yes Historical Provider, MD  diclofenac (VOLTAREN) 75 MG EC tablet TAKE 1 TABLET TWICE A DAY 02/15/16  Yes [name], MD  ferrous sulfate (SLOW FE) 160 (50 Fe) MG TBCR SR tablet Take 1 tablet by mouth 2 (two) times a week.    Yes Historical Provider, MD  Multiple Vitamins-Minerals (WOMENS 50+ MULTI VITAMIN/MIN) TABS Take by mouth.   Yes Historical Provider, MD  OMEGA-3 FATTY ACIDS PO Take by mouth.   Yes Historical Provider, MD    Allergies  Allergen Reactions  . Antihistamines, Chlorpheniramine-Type Other (See Comments)    Makes her feel high  . Oxymetazoline Other (See Comments)    Makes her "high"  . Prednisone     lethargy and anger  . Lipoic Acid Other (See Comments)    Numbness in hands    Past Surgical History:  Procedure Laterality Date  . CERVICAL CONE BIOPSY    . KNEE ARTHROSCOPY Bilateral   . THYMECTOMY  07/2014  . TONSILLECTOMY    . TUBAL LIGATION      Social History  Substance Use Topics  . Smoking status: Current Every Day Smoker  Packs/day: 1.00    Years: 48.00    Types: Cigarettes  . Smokeless tobacco: Never Used  . Alcohol use No     Medication list has been reviewed and updated.   Physical Exam  Constitutional: She is oriented to person, place, and time. She appears well-developed. No distress.  HENT:  Head: Normocephalic and atraumatic.  Cardiovascular: Normal rate, regular rhythm and normal heart sounds.   Pulmonary/Chest: Effort normal and breath sounds normal. No respiratory distress.  Musculoskeletal: Normal range of motion.       Feet:  Tender at area marked  Neurological: She is alert and oriented to  person, place, and time.  Skin: Skin is warm and dry. No rash noted.  Psychiatric: She has a normal mood and affect. Her behavior is normal. Thought content normal.  Nursing note and vitals reviewed.   BP (!) 160/92   Pulse 97   Resp 16   Ht 5\' 4"  (1.626 m)   Wt 176 lb 12.8 oz (80.2 kg)   SpO2 96%   BMI 30.35 kg/m   Assessment and Plan: 1. Plantar fasciitis Continue voltaren orally and add topical Use ice 20 minutes three times per day - Diclofenac Sodium 1.5 % SOLN; Place 1 mL onto the skin 3 (three) times daily.  Dispense: 150 mL; Refill: 5  2. Essential hypertension Encouraged her to try the losartan 25 mg  3. Ascending aortic aneurysm ([place]) Stressed again the need for BP control and smoking cessation   Halina Maidens, MD [facility]  03/05/2016

## 2016-03-07 ENCOUNTER — Other Ambulatory Visit: Payer: Self-pay

## 2016-03-10 ENCOUNTER — Other Ambulatory Visit: Payer: Self-pay | Admitting: Internal Medicine

## 2016-03-10 ENCOUNTER — Telehealth: Payer: Self-pay

## 2016-03-10 DIAGNOSIS — J449 Chronic obstructive pulmonary disease, unspecified: Secondary | ICD-10-CM

## 2016-03-10 MED ORDER — DICLOFENAC SODIUM 3 % TD GEL: 1.0000 "application " | Freq: Four times a day (QID) | TRANSDERMAL | 2 refills | Status: DC | PRN

## 2016-03-10 NOTE — Progress Notes (Unsigned)
en

## 2016-03-10 NOTE — Telephone Encounter (Signed)
Done

## 2016-03-10 NOTE — Telephone Encounter (Signed)
Only the Diclofeniac in Gel is covered please send to pharmacy.

## 2016-03-11 ENCOUNTER — Encounter: Payer: Self-pay | Admitting: Internal Medicine

## 2016-03-11 DIAGNOSIS — M722 Plantar fascial fibromatosis: Secondary | ICD-10-CM | POA: Insufficient documentation

## 2016-03-20 ENCOUNTER — Telehealth: Payer: Self-pay | Admitting: Internal Medicine

## 2016-03-20 NOTE — Telephone Encounter (Signed)
Called pt about Rx. Diclofenac has been denied due to only covered for this diagnosed--Actinic keratosis---told pt can try icy hot recommended by Dr Army Melia

## 2016-04-08 ENCOUNTER — Other Ambulatory Visit: Payer: Self-pay | Admitting: Internal Medicine

## 2016-04-08 MED ORDER — BACLOFEN 10 MG PO TABS: 10.0000 mg | ORAL_TABLET | Freq: Three times a day (TID) | ORAL | 1 refills | Status: DC

## 2016-04-30 ENCOUNTER — Other Ambulatory Visit: Payer: Self-pay | Admitting: Internal Medicine

## 2016-04-30 DIAGNOSIS — M5136 Other intervertebral disc degeneration, lumbar region: Secondary | ICD-10-CM

## 2016-04-30 MED ORDER — DICLOFENAC SODIUM 75 MG PO TBEC: 75.0000 mg | DELAYED_RELEASE_TABLET | Freq: Two times a day (BID) | ORAL | 1 refills | Status: DC

## 2016-05-21 ENCOUNTER — Encounter: Payer: Self-pay | Admitting: Internal Medicine

## 2016-05-21 ENCOUNTER — Ambulatory Visit (INDEPENDENT_AMBULATORY_CARE_PROVIDER_SITE_OTHER): Payer: BLUE CROSS/BLUE SHIELD | Admitting: Internal Medicine

## 2016-05-21 VITALS — BP 146/86 | HR 88 | Ht 64.0 in | Wt 176.0 lb

## 2016-05-21 DIAGNOSIS — J438 Other emphysema: Secondary | ICD-10-CM

## 2016-05-21 DIAGNOSIS — Z0000 Encounter for general adult medical examination without abnormal findings: Secondary | ICD-10-CM | POA: Diagnosis not present

## 2016-05-21 DIAGNOSIS — I10 Essential (primary) hypertension: Secondary | ICD-10-CM | POA: Diagnosis not present

## 2016-05-21 DIAGNOSIS — E041 Nontoxic single thyroid nodule: Secondary | ICD-10-CM | POA: Diagnosis not present

## 2016-05-21 DIAGNOSIS — E042 Nontoxic multinodular goiter: Secondary | ICD-10-CM | POA: Insufficient documentation

## 2016-05-21 LAB — POCT URINALYSIS DIPSTICK
BILIRUBIN UA: NEGATIVE
Glucose, UA: NEGATIVE
KETONES UA: NEGATIVE
LEUKOCYTES UA: NEGATIVE
Nitrite, UA: NEGATIVE
PH UA: 5
Protein, UA: NEGATIVE
RBC UA: NEGATIVE
Spec Grav, UA: 1.01
Urobilinogen, UA: 0.2

## 2016-05-21 NOTE — Patient Instructions (Signed)
Health Maintenance  Topic Date Due  . MAMMOGRAM  05/08/2014  . PAP SMEAR  04/08/2016  . INFLUENZA VACCINE  04/07/2018 (Originally 11/06/2015)  . COLONOSCOPY  04/07/2018 (Originally 07/14/2002)  . ZOSTAVAX  05/20/2020 (Originally 07/13/2012)  . Hepatitis C Screening  05/20/2025 (Originally 1950/10/10)  . TETANUS/TDAP  05/20/2025  . HIV Screening  Addressed

## 2016-05-21 NOTE — Progress Notes (Signed)
Date:  05/21/2016   Name:  Chelsea Santana   DOB:  [birth year]   MRN:  [id number]   Chief Complaint: Annual Exam (No PAP. Pt requesting to get thyroid blood work done.) Chelsea Santana is a 66 y.o. female who presents today for her Complete Annual Exam. She feels well. She reports exercising some but staying very active. She reports she is sleeping fairly well. She denies breast problems but does not do her own exam.  She wants to wait until next year to do mammogram.  Hypertension  This is a chronic problem. The problem is unchanged. Condition status: fair control - always higher at home ~ 120/70. Pertinent negatives include no chest pain, headaches, palpitations or shortness of breath. Past treatments include angiotensin blockers. The current treatment provides moderate improvement. Compliance problems include medication side effects (edema with higher dose cozaar - now taking 25 mg qod).  Identifiable causes of hypertension include a thyroid problem.  Hyperlipidemia  This is a chronic problem. Recent lipid tests were reviewed and are variable. Pertinent negatives include no chest pain or shortness of breath. Treatments tried: red yeast rice.  Thyroid Problem  Presents for follow-up visit. Symptoms include weight gain. Patient reports no anxiety, constipation, depressed mood, diarrhea, fatigue, hoarse voice, palpitations or tremors. Her past medical history is significant for hyperlipidemia.  COPD - recent URI with increased use of albuterol but improving. In general uses albuterol rarely.  No chronic cough, minimal SOB.  She continues to smoke and it not interested in quitting at this time.   Review of Systems  Constitutional: Positive for unexpected weight change and weight gain. Negative for chills, fatigue and fever.  HENT: Negative for congestion, hearing loss, hoarse voice, tinnitus, trouble swallowing and voice change.   Eyes: Negative for visual disturbance.  Respiratory: Positive for  cough. Negative for chest tightness, shortness of breath and wheezing.   Cardiovascular: Negative for chest pain, palpitations and leg swelling.  Gastrointestinal: Negative for abdominal pain, constipation, diarrhea and vomiting.  Endocrine: Negative for polydipsia and polyuria.  Genitourinary: Negative for dysuria, frequency, vaginal bleeding and vaginal discharge.  Musculoskeletal: Positive for back pain and gait problem. Negative for arthralgias and joint swelling.  Skin: Negative for color change and rash.  Allergic/Immunologic: Negative for environmental allergies.  Neurological: Negative for dizziness, tremors and headaches.  Hematological: Negative for adenopathy. Does not bruise/bleed easily.  Psychiatric/Behavioral: Negative for dysphoric mood and sleep disturbance. The patient is not nervous/anxious.     Patient Active Problem List   Diagnosis Date Noted  . Thyroid nodule, uninodular 05/21/2016  . Plantar fasciitis 03/11/2016  . Ascending aortic aneurysm ([place]) 05/20/2015  . Coronary artery calcification seen on CT scan 05/20/2015  . Disease of spinal cord ([place]) 03/06/2015  . Degeneration of intervertebral disc of lumbar region 02/17/2015  . H/O abnormal cervical Papanicolaou smear 02/17/2015  . Compulsive tobacco user syndrome 02/17/2015  . Avitaminosis D 02/17/2015  . Thymoma 07/17/2014  . Chronic obstructive pulmonary disease ([place]) 05/25/2014  . Leg weakness 04/14/2013  . Cardiac murmur 08/31/2012  . Essential hypertension 08/31/2012  . CA of skin 08/31/2012    Prior to Admission medications   Medication Sig Start Date End Date Taking? Authorizing Provider  baclofen (LIORESAL) 10 MG tablet Take 1 tablet (10 mg total) by mouth 3 (three) times daily. Patient taking differently: Take 5 mg by mouth 3 (three) times daily.  04/08/16  Yes [name], MD  Cholecalciferol (VITAMIN D) 2000 UNITS CAPS Take 1  capsule by mouth daily.   Yes Historical Provider, MD  diclofenac  (VOLTAREN) 75 MG EC tablet Take 1 tablet (75 mg total) by mouth 2 (two) times daily. 04/30/16  Yes [name], MD  ferrous sulfate (SLOW FE) 160 (50 Fe) MG TBCR SR tablet Take 1 tablet by mouth 2 (two) times a week.    Yes Historical Provider, MD  losartan (COZAAR) 50 MG tablet Take 25 mg by mouth every other day.  03/10/16  Yes [name], MD  Multiple Vitamins-Minerals (WOMENS 50+ Ava VITAMIN/MIN) TABS Take by mouth.   Yes Historical Provider, MD  PROAIR HFA 108 (90 Base) MCG/ACT inhaler INHALE 2 PUFFS INTO LUNGS EVERY 6 HOURS AS NEEDED FOR WHEEZING OR SHORTNESS OF BREATH 03/10/16  Yes [name], MD  Red Yeast Rice 600 MG TABS Take 600 mg by mouth 1 day or 1 dose. Cardiologist told to take for cholesterol.   Yes Historical Provider, MD  OMEGA-3 FATTY ACIDS PO Take by mouth.    Historical Provider, MD    Allergies  Allergen Reactions  . Antihistamines, Chlorpheniramine-Type Other (See Comments)    Makes her feel high  . Oxymetazoline Other (See Comments)    Makes her "high"  . Prednisone     lethargy and anger  . Lipoic Acid Other (See Comments)    Numbness in hands    Past Surgical History:  Procedure Laterality Date  . CERVICAL CONE BIOPSY    . KNEE ARTHROSCOPY Bilateral   . THYMECTOMY  07/2014  . TONSILLECTOMY    . TUBAL LIGATION      Social History  Substance Use Topics  . Smoking status: Current Every Day Smoker    Packs/day: 1.00    Years: 48.00    Types: Cigarettes  . Smokeless tobacco: Never Used  . Alcohol use No     Medication list has been reviewed and updated.   Physical Exam  Constitutional: She is oriented to person, place, and time. She appears well-developed and well-nourished. No distress.  HENT:  Head: Normocephalic and atraumatic.  Right Ear: Tympanic membrane and ear canal normal.  Left Ear: Tympanic membrane and ear canal normal.  Nose: Right sinus exhibits no maxillary sinus tenderness. Left sinus exhibits no maxillary sinus  tenderness.  Mouth/Throat: Uvula is midline and oropharynx is clear and moist. No posterior oropharyngeal erythema.  Eyes: Conjunctivae and EOM are normal. Right eye exhibits no discharge. Left eye exhibits no discharge. No scleral icterus.  Neck: Normal range of motion. Carotid bruit is not present. No erythema present. No thyromegaly present.  Cardiovascular: Normal rate, regular rhythm, normal heart sounds and normal pulses.   No murmur heard. Pulmonary/Chest: Effort normal. No respiratory distress. She has decreased breath sounds. She has no wheezes. She has no rhonchi. Right breast exhibits no mass, no nipple discharge, no skin change and no tenderness. Left breast exhibits no mass, no nipple discharge, no skin change and no tenderness.  Abdominal: Soft. Bowel sounds are normal. There is no hepatosplenomegaly. There is no tenderness. There is no CVA tenderness.  Musculoskeletal: Normal range of motion.       Lumbar back: She exhibits tenderness.  Lymphadenopathy:    She has no cervical adenopathy.    She has no axillary adenopathy.  Neurological: She is alert and oriented to person, place, and time. She has normal reflexes. No sensory deficit.  Skin: Skin is warm, dry and intact. No rash noted.  Psychiatric: She has a normal mood and  affect. Her speech is normal and behavior is normal. Thought content normal.  Nursing note and vitals reviewed.   BP (!) 146/86   Pulse 88   Ht 5\' 4"  (1.626 m)   Wt 176 lb (79.8 kg)   SpO2 98%   BMI 30.21 kg/m   Assessment and Plan: 1. Annual physical exam Will have mammogram and Pap next year - CBC with Differential/Platelet - Comprehensive metabolic panel - TSH - POCT urinalysis dipstick - Lipid panel  2. Essential hypertension Fair control - continue current therapy  3. Other emphysema ([place]) Stable; uses albuterol PRN  4. Thyroid nodule, uninodular Follow up US ordered by ENT - TSH   Halina Maidens, MD [facility]  05/21/2016

## 2016-05-22 LAB — COMPREHENSIVE METABOLIC PANEL
A/G RATIO: 1.8 (ref 1.2–2.2)
ALBUMIN: 4.4 g/dL (ref 3.6–4.8)
ALT: 17 IU/L (ref 0–32)
AST: 15 IU/L (ref 0–40)
Alkaline Phosphatase: 69 IU/L (ref 39–117)
BUN / CREAT RATIO: 15 (ref 12–28)
BUN: 9 mg/dL (ref 8–27)
Bilirubin Total: 0.3 mg/dL (ref 0.0–1.2)
CALCIUM: 9.4 mg/dL (ref 8.7–10.3)
CO2: 27 mmol/L (ref 18–29)
CREATININE: 0.62 mg/dL (ref 0.57–1.00)
Chloride: 94 mmol/L — ABNORMAL LOW (ref 96–106)
GFR, EST AFRICAN AMERICAN: 111 mL/min/{1.73_m2} (ref >59)
GFR, EST NON AFRICAN AMERICAN: 96 mL/min/{1.73_m2} (ref >59)
GLOBULIN, TOTAL: 2.5 g/dL (ref 1.5–4.5)
Glucose: 89 mg/dL (ref 65–99)
Potassium: 4.6 mmol/L (ref 3.5–5.2)
SODIUM: 135 mmol/L (ref 134–144)
TOTAL PROTEIN: 6.9 g/dL (ref 6.0–8.5)

## 2016-05-22 LAB — CBC WITH DIFFERENTIAL/PLATELET
BASOS: 0 %
Basophils Absolute: 0 10*3/uL (ref 0.0–0.2)
EOS (ABSOLUTE): 0.2 10*3/uL (ref 0.0–0.4)
EOS: 2 %
HEMATOCRIT: 38.3 % (ref 34.0–46.6)
HEMOGLOBIN: 12.4 g/dL (ref 11.1–15.9)
IMMATURE GRANS (ABS): 0 10*3/uL (ref 0.0–0.1)
IMMATURE GRANULOCYTES: 0 %
Lymphocytes Absolute: 2.9 10*3/uL (ref 0.7–3.1)
Lymphs: 28 %
MCH: 29.6 pg (ref 26.6–33.0)
MCHC: 32.4 g/dL (ref 31.5–35.7)
MCV: 91 fL (ref 79–97)
MONOCYTES: 8 %
Monocytes Absolute: 0.9 10*3/uL (ref 0.1–0.9)
NEUTROS PCT: 62 %
Neutrophils Absolute: 6.4 10*3/uL (ref 1.4–7.0)
Platelets: 334 10*3/uL (ref 150–379)
RBC: 4.19 x10E6/uL (ref 3.77–5.28)
RDW: 13.8 % (ref 12.3–15.4)
WBC: 10.4 10*3/uL (ref 3.4–10.8)

## 2016-05-22 LAB — LIPID PANEL
CHOL/HDL RATIO: 3.7 ratio (ref 0.0–4.4)
Cholesterol, Total: 238 mg/dL — ABNORMAL HIGH (ref 100–199)
HDL: 65 mg/dL (ref >39)
LDL CALC: 149 mg/dL — AB (ref 0–99)
Triglycerides: 120 mg/dL (ref 0–149)
VLDL Cholesterol Cal: 24 mg/dL (ref 5–40)

## 2016-05-22 LAB — TSH: TSH: 1.09 u[IU]/mL (ref 0.450–4.500)

## 2016-06-16 ENCOUNTER — Encounter (INDEPENDENT_AMBULATORY_CARE_PROVIDER_SITE_OTHER): Payer: Self-pay

## 2016-06-16 ENCOUNTER — Ambulatory Visit (INDEPENDENT_AMBULATORY_CARE_PROVIDER_SITE_OTHER): Payer: Self-pay | Admitting: Vascular Surgery

## 2016-06-17 ENCOUNTER — Ambulatory Visit: Payer: BLUE CROSS/BLUE SHIELD

## 2016-07-23 ENCOUNTER — Other Ambulatory Visit: Payer: Self-pay | Admitting: Oncology

## 2016-07-23 DIAGNOSIS — D150 Benign neoplasm of thymus: Principal | ICD-10-CM

## 2016-07-23 DIAGNOSIS — D4989 Neoplasm of unspecified behavior of other specified sites: Secondary | ICD-10-CM

## 2016-07-29 ENCOUNTER — Other Ambulatory Visit
Admission: RE | Admit: 2016-07-29 | Discharge: 2016-07-29 | Disposition: A | Discharge status: Home / Self Care | Payer: BLUE CROSS/BLUE SHIELD | Source: Ambulatory Visit | Attending: Oncology | Admitting: Oncology

## 2016-07-29 ENCOUNTER — Ambulatory Visit
Admission: RE | Admit: 2016-07-29 | Discharge: 2016-07-29 | Disposition: A | Discharge status: Home / Self Care | Payer: BLUE CROSS/BLUE SHIELD | Source: Ambulatory Visit | Attending: Oncology | Admitting: Oncology

## 2016-07-29 DIAGNOSIS — D150 Benign neoplasm of thymus: Secondary | ICD-10-CM | POA: Insufficient documentation

## 2016-07-29 DIAGNOSIS — D4989 Neoplasm of unspecified behavior of other specified sites: Secondary | ICD-10-CM

## 2016-07-29 DIAGNOSIS — I2510 Atherosclerotic heart disease of native coronary artery without angina pectoris: Secondary | ICD-10-CM | POA: Diagnosis not present

## 2016-07-29 DIAGNOSIS — I700 Atherosclerosis of aorta: Secondary | ICD-10-CM | POA: Insufficient documentation

## 2016-07-29 HISTORY — DX: Essential (primary) hypertension: I10

## 2016-07-29 LAB — CREATININE, SERUM
CREATININE: 0.67 mg/dL (ref 0.44–1.00)
GFR calc Af Amer: >60 mL/min (ref >60)

## 2016-07-29 MED ORDER — IOPAMIDOL (ISOVUE-300) INJECTION 61%
75.0000 mL | Freq: Once | INTRAVENOUS | Status: AC | PRN
  Administered 2016-07-29: 75 mL via INTRAVENOUS

## 2016-07-30 NOTE — Progress Notes (Signed)
[facility]  [phone] [phone]) [phone]  ID: Chelsea Santana OB: 26-Aug-1950  MR#: [id number]  JKD#:[id number]  Patient Care Team: [name], MD as PCP - General (Family Medicine) [name], MD as Consulting Physician (Cardiology)  CHIEF COMPLAINT: Stage IIa thymoma.  INTERVAL HISTORY: Patient returns to clinic today for further evaluation and discussion of her imaging results. She currently feels well and is asymptomatic. She has no neurologic complaints. She denies any recent fevers or illnesses. She has a good appetite and denies weight loss. She has no chest pain or shortness of breath. She denies any nausea, vomiting, constipation, or diarrhea. She has no urinary complaints. Patient feels at her baseline and offers no specific complaints today.  REVIEW OF SYSTEMS:   Review of Systems  Constitutional: Negative.  Negative for fever, malaise/fatigue and weight loss.  Respiratory: Negative.  Negative for cough, hemoptysis and shortness of breath.   Cardiovascular: Negative.  Negative for chest pain.  Gastrointestinal: Negative.  Negative for abdominal pain.  Genitourinary: Negative.   Musculoskeletal: Negative.   Skin: Negative.  Negative for rash.  Neurological: Negative.  Negative for sensory change and weakness.  Psychiatric/Behavioral: Negative.  The patient is not nervous/anxious.     As per HPI. Otherwise, a complete review of systems is negative.  PAST MEDICAL HISTORY: Past Medical History:  Diagnosis Date  . Cancer (HCC)    Thymoma, Stage II  . Hypertension     PAST SURGICAL HISTORY: Past Surgical History:  Procedure Laterality Date  . CERVICAL CONE BIOPSY    . KNEE ARTHROSCOPY Bilateral   . THYMECTOMY  07/2014  . TONSILLECTOMY    . TUBAL LIGATION      FAMILY HISTORY Family History  Problem Relation Age of Onset  . Multiple myeloma Mother   . Lung cancer Father   . Diabetes Sister   . Diabetes Brother         ADVANCED DIRECTIVES:    HEALTH MAINTENANCE: Social History  Substance Use Topics  . Smoking status: Current Every Day Smoker    Packs/day: 1.00    Years: 48.00    Types: Cigarettes  . Smokeless tobacco: Never Used  . Alcohol use No     Colonoscopy:  PAP:  Bone density:  Lipid panel:  Allergies  Allergen Reactions  . Antihistamines, Chlorpheniramine-Type Other (See Comments)    Makes her feel high  . Oxymetazoline Other (See Comments)    Makes her "high"  . Prednisone     lethargy and anger  . Lipoic Acid Other (See Comments)    Numbness in hands    Current Outpatient Prescriptions  Medication Sig Dispense Refill  . baclofen (LIORESAL) 10 MG tablet Take 1 tablet (10 mg total) by mouth 3 (three) times daily. (Patient taking differently: Take 5 mg by mouth 3 (three) times daily. ) 270 tablet 1  . Cholecalciferol (VITAMIN D) 2000 UNITS CAPS Take 1 capsule by mouth daily.    . diclofenac (VOLTAREN) 75 MG EC tablet Take 1 tablet (75 mg total) by mouth 2 (two) times daily. 180 tablet 1  . ferrous sulfate (SLOW FE) 160 (50 Fe) MG TBCR SR tablet Take 1 tablet by mouth 2 (two) times a week.     . losartan (COZAAR) 50 MG tablet Take 25 mg by mouth every other day.   11  . Multiple Vitamins-Minerals (WOMENS 50+ MULTI VITAMIN/MIN) TABS Take by mouth.    . OMEGA-3 FATTY ACIDS PO Take by mouth.    Marland Kitchen  PROAIR HFA 108 (90 Base) MCG/ACT inhaler INHALE 2 PUFFS INTO LUNGS EVERY 6 HOURS AS NEEDED FOR WHEEZING OR SHORTNESS OF BREATH 8.5 Inhaler 0  . Red Yeast Rice 600 MG TABS Take 600 mg by mouth 1 day or 1 dose. Cardiologist told to take for cholesterol.     No current facility-administered medications for this visit.     OBJECTIVE: Vitals:   08/01/16 1110  BP: (!) 141/80  Pulse: 75  Resp: 18  Temp: 97.4 F (36.3 C)     Body mass index is 30.41 kg/m.    ECOG FS:0 - Asymptomatic  General: Well-developed, well-nourished, no acute distress. Eyes: Pink conjunctiva, anicteric  sclera. HEENT: Normocephalic, moist mucous membranes, clear oropharnyx. Lungs: Clear to auscultation bilaterally. Heart: Regular rate and rhythm. No rubs, murmurs, or gallops. Abdomen: Soft, nontender, nondistended. No organomegaly noted, normoactive bowel sounds. Musculoskeletal: No edema, cyanosis, or clubbing. Neuro: Alert, answering all questions appropriately. Cranial nerves grossly intact. Skin: No rashes or petechiae noted. Psych: Normal affect. Lymphatics: No cervical, calvicular, axillary or inguinal LAD.   LAB RESULTS:  Lab Results  Component Value Date   NA 135 05/21/2016   K 4.6 05/21/2016   CL 94 (L) 05/21/2016   CO2 27 05/21/2016   GLUCOSE 89 05/21/2016   BUN 9 05/21/2016   CREATININE 0.67 07/29/2016   CALCIUM 9.4 05/21/2016   PROT 6.9 05/21/2016   ALBUMIN 4.4 05/21/2016   AST 15 05/21/2016   ALT 17 05/21/2016   ALKPHOS 69 05/21/2016   BILITOT 0.3 05/21/2016   GFRNONAA >60 07/29/2016   GFRAA >60 07/29/2016    Lab Results  Component Value Date   WBC 10.4 05/21/2016   NEUTROABS 6.4 05/21/2016   HCT 38.3 05/21/2016   MCV 91 05/21/2016   PLT 334 05/21/2016     STUDIES: Ct Chest W Contrast  Result Date: 07/29/2016 CLINICAL DATA:  History of stage II thymoma with surgery. No complaints. Thymectomy 4/16. EXAM: CT CHEST WITH CONTRAST TECHNIQUE: Multidetector CT imaging of the chest was performed during intravenous contrast administration. CONTRAST:  22mL ISOVUE-300 IOPAMIDOL (ISOVUE-300) INJECTION 61% COMPARISON:  06/07/2015 FINDINGS: Cardiovascular: Advanced aortic and branch vessel atherosclerosis. Mild cardiomegaly, without pericardial effusion. Multivessel coronary artery atherosclerosis. No central pulmonary embolism, on this non-dedicated study. Mediastinum/Nodes: Nonspecific right-sided thyroid nodule is similar, 7 mm. No supraclavicular adenopathy. No mediastinal or hilar adenopathy. A prevascular 7 mm node is not pathologic by size criteria and similar,  including on image 66/series 5. No residual or recurrent anterior mediastinal mass. Lungs/Pleura: No pleural fluid. Minimal biapical pleural-parenchymal scarring. Mild centrilobular emphysema. Mild volume loss in the left lower lobe laterally. No suspicious pulmonary nodule or mass. Upper Abdomen: Normal imaged portions of the liver, spleen, stomach, pancreas, adrenal glands, left kidney. Musculoskeletal: Convex right thoracic spine curvature. IMPRESSION: 1.  No acute process or evidence of metastatic disease in the chest. 2.  Coronary artery atherosclerosis. Aortic atherosclerosis. Electronically Signed   By: Jeronimo Greaves M.D.   On: 07/29/2016 14:34    ASSESSMENT: Stage IIa thymoma.  PLAN:    1. Thymoma: Patient's laboratory work, imaging, and consult notes from outside facility were previously reviewed extensively and independently.  Patient was diagnosed in April 2016. Surgical margins reported as negative with only microscopic transcapsular invasion. Patient was also evaluated by radiation oncology at that time and it was determined that no XRT was necessary. CT scan results review independently and reported as above without any evidence of recurrent or progressive disease. Return to clinic  in 1 year with repeat imaging and further evaluation.  Approximately 20 minutes was spent in discussion of which greater than 50% was consultation.   Patient expressed understanding and was in agreement with this plan. She also understands that She can call clinic at any time with any questions, concerns, or complaints.   Lloyd Huger, MD   08/03/2016 5:23 PM

## 2016-08-01 ENCOUNTER — Encounter: Payer: Self-pay | Admitting: Oncology

## 2016-08-01 ENCOUNTER — Inpatient Hospital Stay: Payer: BLUE CROSS/BLUE SHIELD | Attending: Oncology | Admitting: Oncology

## 2016-08-01 VITALS — BP 141/80 | HR 75 | Temp 97.4°F | Resp 18 | Wt 177.1 lb

## 2016-08-01 DIAGNOSIS — I10 Essential (primary) hypertension: Secondary | ICD-10-CM

## 2016-08-01 DIAGNOSIS — I700 Atherosclerosis of aorta: Secondary | ICD-10-CM | POA: Diagnosis not present

## 2016-08-01 DIAGNOSIS — I517 Cardiomegaly: Secondary | ICD-10-CM

## 2016-08-01 DIAGNOSIS — Z801 Family history of malignant neoplasm of trachea, bronchus and lung: Secondary | ICD-10-CM | POA: Diagnosis not present

## 2016-08-01 DIAGNOSIS — Z79899 Other long term (current) drug therapy: Secondary | ICD-10-CM | POA: Diagnosis not present

## 2016-08-01 DIAGNOSIS — D4989 Neoplasm of unspecified behavior of other specified sites: Secondary | ICD-10-CM

## 2016-08-01 DIAGNOSIS — D150 Benign neoplasm of thymus: Secondary | ICD-10-CM

## 2016-08-01 DIAGNOSIS — I2510 Atherosclerotic heart disease of native coronary artery without angina pectoris: Secondary | ICD-10-CM | POA: Diagnosis not present

## 2016-08-01 DIAGNOSIS — F17210 Nicotine dependence, cigarettes, uncomplicated: Secondary | ICD-10-CM | POA: Diagnosis not present

## 2016-08-01 DIAGNOSIS — E041 Nontoxic single thyroid nodule: Secondary | ICD-10-CM

## 2016-08-01 DIAGNOSIS — Z85238 Personal history of other malignant neoplasm of thymus: Secondary | ICD-10-CM | POA: Diagnosis not present

## 2016-08-01 NOTE — Progress Notes (Signed)
Patient is here for follow up, she is doing well 

## 2017-01-01 ENCOUNTER — Other Ambulatory Visit: Payer: Self-pay | Admitting: Internal Medicine

## 2017-01-01 DIAGNOSIS — M5136 Other intervertebral disc degeneration, lumbar region: Secondary | ICD-10-CM

## 2017-03-27 ENCOUNTER — Telehealth: Payer: Self-pay

## 2017-03-27 NOTE — Telephone Encounter (Signed)
Pt informed and sent to front desk to schedule physical.

## 2017-03-27 NOTE — Telephone Encounter (Signed)
Patient called stating diclofenac 75 mg is no longer covered by insurance. Was told if we changed rx to 50 mg 3 times daily they will cover it. Wants to know if we can send new rx into [facility] in [place].   Please Advise.

## 2017-03-27 NOTE — Telephone Encounter (Signed)
I will change the prescription but she needs to schedule her annual exam with me - due in February.

## 2017-05-15 ENCOUNTER — Encounter: Payer: Self-pay | Admitting: Internal Medicine

## 2017-05-15 ENCOUNTER — Ambulatory Visit (INDEPENDENT_AMBULATORY_CARE_PROVIDER_SITE_OTHER): Payer: BLUE CROSS/BLUE SHIELD | Admitting: Internal Medicine

## 2017-05-15 VITALS — BP 150/84 | HR 86 | Ht 64.0 in | Wt 170.0 lb

## 2017-05-15 DIAGNOSIS — N3001 Acute cystitis with hematuria: Secondary | ICD-10-CM | POA: Diagnosis not present

## 2017-05-15 LAB — POC URINALYSIS WITH MICROSCOPIC (NON AUTO)MANUAL RESULT
BILIRUBIN UA: NEGATIVE
Crystals: 0
Epithelial cells, urine per micros: 4
Glucose, UA: NEGATIVE
KETONES UA: NEGATIVE
Mucus, UA: 0
Nitrite, UA: NEGATIVE
PH UA: 7 (ref 5.0–8.0)
RBC: 3 M/uL — AB (ref 4.04–5.48)
SPEC GRAV UA: 1.01 (ref 1.010–1.025)
UROBILINOGEN UA: 0.2 U/dL

## 2017-05-15 MED ORDER — CEPHALEXIN 500 MG PO CAPS: 500.0000 mg | ORAL_CAPSULE | Freq: Four times a day (QID) | ORAL | 0 refills | Status: AC

## 2017-05-15 NOTE — Progress Notes (Signed)
Date:  05/15/2017   Name:  Chelsea Santana   DOB:  Feb 20, 1951   MRN:  [id number]   Chief Complaint: Urinary Incontinence (Started Monday with dripping during the day. Monday night every 15 mins was pouring out urine. This lasted two nights. Wednesday almost passed out twice from lack of sleep since urinating too much. Started drinking a lot of water.  )  Dysuria   This is a new problem. The current episode started in the past 7 days. The problem occurs every urination. The problem has been gradually improving. There has been no fever. Associated symptoms include chills, frequency and urgency. Pertinent negatives include no hematuria.      Review of Systems  Constitutional: Positive for chills and fever. Negative for fatigue.  Respiratory: Negative for cough, chest tightness and wheezing.   Cardiovascular: Negative for chest pain and palpitations.  Gastrointestinal: Negative for abdominal pain, blood in stool and constipation.  Genitourinary: Positive for dysuria, frequency and urgency. Negative for hematuria.    Patient Active Problem List   Diagnosis Date Noted  . Thyroid nodule, uninodular 05/21/2016  . Plantar fasciitis 03/11/2016  . Ascending aortic aneurysm ([place]) 05/20/2015  . Coronary artery calcification seen on CT scan 05/20/2015  . Disease of spinal cord ([place]) 03/06/2015  . Degeneration of intervertebral disc of lumbar region 02/17/2015  . H/O abnormal cervical Papanicolaou smear 02/17/2015  . Compulsive tobacco user syndrome 02/17/2015  . Avitaminosis D 02/17/2015  . Thymoma 07/17/2014  . Chronic obstructive pulmonary disease ([place]) 05/25/2014  . Leg weakness 04/14/2013  . Cardiac murmur 08/31/2012  . Essential hypertension 08/31/2012  . CA of skin 08/31/2012    Prior to Admission medications   Medication Sig Start Date End Date Taking? Authorizing Provider  baclofen (LIORESAL) 10 MG tablet Take 1 tablet (10 mg total) by mouth 3 (three) times daily. Patient taking  differently: Take 5 mg by mouth 3 (three) times daily.  04/08/16  Yes [name], MD  Cholecalciferol (VITAMIN D) 2000 UNITS CAPS Take 1 capsule by mouth daily.   Yes [provider]  diclofenac (VOLTAREN) 75 MG EC tablet TAKE ONE TABLET BY MOUTH TWICE DAILY 01/01/17  Yes [name], MD  Multiple Vitamins-Minerals (WOMENS 50+ [place] VITAMIN/MIN) TABS Take by mouth.   Yes [provider]  OMEGA-3 FATTY ACIDS PO Take by mouth.   Yes [provider]  PROAIR HFA 108 (90 Base) MCG/ACT inhaler INHALE 2 PUFFS INTO LUNGS EVERY 6 HOURS AS NEEDED FOR WHEEZING OR SHORTNESS OF BREATH 03/10/16  Yes [name], MD    Allergies  Allergen Reactions  . Antihistamines, Chlorpheniramine-Type Other (See Comments)    Makes her feel high  . Oxymetazoline Other (See Comments)    Makes her "high"  . Prednisone     lethargy and anger  . Lipoic Acid Other (See Comments)    Numbness in hands    Past Surgical History:  Procedure Laterality Date  . CERVICAL CONE BIOPSY    . KNEE ARTHROSCOPY Bilateral   . THYMECTOMY  07/2014  . TONSILLECTOMY    . TUBAL LIGATION      Social History   Tobacco Use  . Smoking status: Current Every Day Smoker    Packs/day: 1.00    Years: 48.00    Pack years: 48.00    Types: Cigarettes  . Smokeless tobacco: Never Used  Substance Use Topics  . Alcohol use: No    Alcohol/week: 0.0 oz  . Drug use:  No     Medication list has been reviewed and updated.  PHQ 2/9 Scores 05/15/2017 10/03/2015 09/18/2015  PHQ - 2 Score 0 0 0    Physical Exam  Constitutional: She appears well-developed and well-nourished.  Cardiovascular: Normal rate, regular rhythm and normal heart sounds.  Pulmonary/Chest: Effort normal and breath sounds normal. No respiratory distress.  Abdominal: Soft. Bowel sounds are normal. There is tenderness in the suprapubic area. There is no rebound, no guarding and no CVA tenderness.  Psychiatric: She has a normal mood and  affect.  Nursing note and vitals reviewed.   BP (!) 150/84   Pulse 86   Ht 5\' 4"  (1.626 m)   Wt 170 lb (77.1 kg)   SpO2 97%   BMI 29.18 kg/m   Assessment and Plan: 1. Acute cystitis with hematuria Continue fluids - POC urinalysis w microscopic (non auto) - cephALEXin (KEFLEX) 500 MG capsule; Take 1 capsule (500 mg total) by mouth 4 (four) times daily for 5 days.  Dispense: 20 capsule; Refill: 0   Meds ordered this encounter  Medications  . cephALEXin (KEFLEX) 500 MG capsule    Sig: Take 1 capsule (500 mg total) by mouth 4 (four) times daily for 5 days.    Dispense:  20 capsule    Refill:  0    Partially dictated using Editor, commissioning. Any errors are unintentional.  Halina Maidens, MD [facility]  05/15/2017

## 2017-06-02 ENCOUNTER — Emergency Department: Payer: BLUE CROSS/BLUE SHIELD

## 2017-06-02 ENCOUNTER — Other Ambulatory Visit: Payer: Self-pay

## 2017-06-02 ENCOUNTER — Emergency Department
Admission: EM | Admit: 2017-06-02 | Discharge: 2017-06-02 | Disposition: A | Discharge status: Home / Self Care | Payer: BLUE CROSS/BLUE SHIELD | Attending: Emergency Medicine | Admitting: Emergency Medicine

## 2017-06-02 ENCOUNTER — Encounter: Payer: Self-pay | Admitting: Emergency Medicine

## 2017-06-02 DIAGNOSIS — Z79899 Other long term (current) drug therapy: Secondary | ICD-10-CM | POA: Diagnosis not present

## 2017-06-02 DIAGNOSIS — R0789 Other chest pain: Secondary | ICD-10-CM | POA: Diagnosis not present

## 2017-06-02 DIAGNOSIS — F17210 Nicotine dependence, cigarettes, uncomplicated: Secondary | ICD-10-CM | POA: Diagnosis not present

## 2017-06-02 DIAGNOSIS — R059 Cough, unspecified: Secondary | ICD-10-CM

## 2017-06-02 DIAGNOSIS — I10 Essential (primary) hypertension: Secondary | ICD-10-CM | POA: Insufficient documentation

## 2017-06-02 DIAGNOSIS — R05 Cough: Secondary | ICD-10-CM | POA: Diagnosis not present

## 2017-06-02 DIAGNOSIS — Z85238 Personal history of other malignant neoplasm of thymus: Secondary | ICD-10-CM | POA: Insufficient documentation

## 2017-06-02 DIAGNOSIS — J449 Chronic obstructive pulmonary disease, unspecified: Secondary | ICD-10-CM | POA: Diagnosis not present

## 2017-06-02 LAB — COMPREHENSIVE METABOLIC PANEL
ALBUMIN: 4 g/dL (ref 3.5–5.0)
ALT: 17 U/L (ref 14–54)
AST: 23 U/L (ref 15–41)
Alkaline Phosphatase: 55 U/L (ref 38–126)
Anion gap: 9 (ref 5–15)
BUN: 8 mg/dL (ref 6–20)
CHLORIDE: 97 mmol/L — AB (ref 101–111)
CO2: 28 mmol/L (ref 22–32)
CREATININE: 0.6 mg/dL (ref 0.44–1.00)
Calcium: 9.4 mg/dL (ref 8.9–10.3)
GFR calc Af Amer: >60 mL/min (ref >60)
GLUCOSE: 134 mg/dL — AB (ref 65–99)
POTASSIUM: 3.4 mmol/L — AB (ref 3.5–5.1)
SODIUM: 134 mmol/L — AB (ref 135–145)
Total Bilirubin: 0.4 mg/dL (ref 0.3–1.2)
Total Protein: 7.6 g/dL (ref 6.5–8.1)

## 2017-06-02 LAB — CBC WITH DIFFERENTIAL/PLATELET
Basophils Absolute: 0 10*3/uL (ref 0–0.1)
Basophils Relative: 0 %
Eosinophils Absolute: 0.1 10*3/uL (ref 0–0.7)
Eosinophils Relative: 1 %
HEMATOCRIT: 36.1 % (ref 35.0–47.0)
HEMOGLOBIN: 12.2 g/dL (ref 12.0–16.0)
LYMPHS PCT: 14 %
Lymphs Abs: 1.7 10*3/uL (ref 1.0–3.6)
MCH: 30.5 pg (ref 26.0–34.0)
MCHC: 33.8 g/dL (ref 32.0–36.0)
MCV: 90 fL (ref 80.0–100.0)
MONO ABS: 0.7 10*3/uL (ref 0.2–0.9)
Monocytes Relative: 6 %
NEUTROS ABS: 9.9 10*3/uL — AB (ref 1.4–6.5)
NEUTROS PCT: 79 %
Platelets: 321 10*3/uL (ref 150–440)
RBC: 4.01 MIL/uL (ref 3.80–5.20)
RDW: 13.7 % (ref 11.5–14.5)
WBC: 12.4 10*3/uL — ABNORMAL HIGH (ref 3.6–11.0)

## 2017-06-02 LAB — LIPASE, BLOOD: Lipase: 42 U/L (ref 11–51)

## 2017-06-02 LAB — TROPONIN I
Troponin I: <0.03 ng/mL (ref <0.03)
Troponin I: <0.03 ng/mL (ref <0.03)

## 2017-06-02 LAB — PROTIME-INR
INR: 1
PROTHROMBIN TIME: 13.1 s (ref 11.4–15.2)

## 2017-06-02 MED ORDER — OXYCODONE-ACETAMINOPHEN 5-325 MG PO TABS
1.0000 | ORAL_TABLET | Freq: Once | ORAL | Status: DC
  Filled 2017-06-02: qty 1

## 2017-06-02 MED ORDER — IPRATROPIUM-ALBUTEROL 0.5-2.5 (3) MG/3ML IN SOLN
3.0000 mL | Freq: Once | RESPIRATORY_TRACT | Status: AC
  Administered 2017-06-02: 3 mL via RESPIRATORY_TRACT
  Filled 2017-06-02: qty 3

## 2017-06-02 MED ORDER — ACETAMINOPHEN 325 MG PO TABS
650.0000 mg | ORAL_TABLET | Freq: Once | ORAL | Status: AC
  Administered 2017-06-02: 650 mg via ORAL
  Filled 2017-06-02: qty 2

## 2017-06-02 MED ORDER — IOPAMIDOL (ISOVUE-370) INJECTION 76%
75.0000 mL | Freq: Once | INTRAVENOUS | Status: AC | PRN
  Administered 2017-06-02: 75 mL via INTRAVENOUS

## 2017-06-02 NOTE — ED Notes (Signed)
Pt taken to Xray at this time.

## 2017-06-02 NOTE — ED Notes (Signed)
Patient refused Percocet. Refused 2nd Tylenol tablet (took 325mg  total).

## 2017-06-02 NOTE — ED Provider Notes (Addendum)
[facility] Emergency Department Provider Note  ____________________________________________   I have reviewed the triage vital signs and the nursing notes. Where available I have reviewed prior notes and, if possible and indicated, outside hospital notes.    HISTORY  Chief Complaint Chest Pain    HPI Chelsea Santana is a 67 y.o. female history of thymoma, hypertension, anxiety COPD and continued smoking, has been coughing for the last week, complains of chest pain which is in the light chest wall, also on the left.  No radiation.  States it hurts to cough and hurts to move.  Began around 230.  Unfortunate, patient also has a history she states of a thoracic aneurysm.  CT scan in 2/16 of the chest does report a very small aneurysmal dilatation at 4 x 3.8, however I did discuss with our radiologist and they feel this likely does not meet criteria for an aneurysm or ectatic aorta, and I note that this was apparently a resident read.  In addition, repeat CT scan in 2016 showed no evidence of aneurysm, performed at the same facility.  Nonetheless patient is sure that she has a thoracic aneurysm.  She does have pain in her back.  She has been coughing.  She does feel short of breath.  She does continue to smoke.  She has a productive cough.  The pain is a pressure-like sensation in her chest worse when she touches or changes position or coughs. It began while she was smoking a cigarette and coughing this afternoon.  She has had chronic back pain she takes muscle relaxants for that.    Past Medical History:  Diagnosis Date  . Cancer (HCC)    Thymoma, Stage II  . Hypertension     Patient Active Problem List   Diagnosis Date Noted  . Thyroid nodule, uninodular 05/21/2016  . Plantar fasciitis 03/11/2016  . Ascending aortic aneurysm ([place]) 05/20/2015  . Coronary artery calcification seen on CT scan 05/20/2015  . Disease of spinal cord ([place]) 03/06/2015  . Degeneration of  intervertebral disc of lumbar region 02/17/2015  . H/O abnormal cervical Papanicolaou smear 02/17/2015  . Compulsive tobacco user syndrome 02/17/2015  . Avitaminosis D 02/17/2015  . Thymoma 07/17/2014  . Chronic obstructive pulmonary disease ([place]) 05/25/2014  . Leg weakness 04/14/2013  . Cardiac murmur 08/31/2012  . Essential hypertension 08/31/2012  . CA of skin 08/31/2012    Past Surgical History:  Procedure Laterality Date  . CERVICAL CONE BIOPSY    . KNEE ARTHROSCOPY Bilateral   . THYMECTOMY  07/2014  . TONSILLECTOMY    . TUBAL LIGATION      Prior to Admission medications   Medication Sig Start Date End Date Taking? Authorizing Provider  baclofen (LIORESAL) 10 MG tablet Take 1 tablet (10 mg total) by mouth 3 (three) times daily. Patient taking differently: Take 5 mg by mouth 3 (three) times daily.  04/08/16   [name], MD  Cholecalciferol (VITAMIN D) 2000 UNITS CAPS Take 1 capsule by mouth daily.    [provider]  diclofenac (VOLTAREN) 75 MG EC tablet TAKE ONE TABLET BY MOUTH TWICE DAILY 01/01/17   [name], MD  Multiple Vitamins-Minerals (WOMENS 50+ [place] VITAMIN/MIN) TABS Take by mouth.    [provider]  OMEGA-3 FATTY ACIDS PO Take by mouth.    [provider]  PROAIR HFA 108 (90 Base) MCG/ACT inhaler INHALE 2 PUFFS INTO LUNGS EVERY 6 HOURS AS NEEDED FOR WHEEZING OR SHORTNESS OF BREATH  03/10/16   [name], MD    Allergies Antihistamines, chlorpheniramine-type; Oxymetazoline; Prednisone; Statins; and Lipoic acid  Family History  Problem Relation Age of Onset  . Multiple myeloma Mother   . Lung cancer Father   . Diabetes Sister   . Diabetes Brother     Social History Social History   Tobacco Use  . Smoking status: Current Every Day Smoker    Packs/day: 1.00    Years: 48.00    Pack years: 48.00    Types: Cigarettes  . Smokeless tobacco: Never Used  Substance Use Topics  . Alcohol use: No    Alcohol/week:  0.0 oz  . Drug use: No    Review of Systems Constitutional: No fever/chills Eyes: No visual changes. ENT: No sore throat. No stiff neck no neck pain Cardiovascular: + chest pain. Respiratory: Denies shortness of breath. Gastrointestinal:   no vomiting.  No diarrhea.  No constipation. Genitourinary: Negative for dysuria. Musculoskeletal: Negative lower extremity swelling Skin: Negative for rash. Neurological: Negative for severe headaches, focal weakness or numbness.   ____________________________________________   PHYSICAL EXAM:  VITAL SIGNS: ED Triage Vitals  Enc Vitals Group     BP 06/02/17 1729 (!) 185/83     Pulse Rate 06/02/17 1729 90     Resp 06/02/17 1729 20     Temp 06/02/17 1729 98 F (36.7 C)     Temp Source 06/02/17 1729 Oral     SpO2 06/02/17 1729 100 %     Weight 06/02/17 1724 170 lb (77.1 kg)     Height 06/02/17 1724 _0  (1.626 m)     Head Circumference --      Peak Flow --      Pain Score 06/02/17 1724 7     Pain Loc --      Pain Edu? --      Excl. in [place]? --     Constitutional: Alert and oriented. Well appearing and in no acute distress. Eyes: Conjunctivae are normal Head: Atraumatic HEENT: No congestion/rhinnorhea. Mucous membranes are moist.  Oropharynx non-erythematous Neck:   Nontender with no meningismus, no masses, no stridor Cardiovascular: Normal rate, regular rhythm. Grossly normal heart sounds.  Good peripheral circulation. Chest, when I touch the patient says she states "ouch that the pain right there both on the right and left costochondral margins.  Also tender when she changes position or sits up in the wrong way.  No crepitus no flail chest noted Respiratory: Normal respiratory effort.  No retractions. occasional slight wheeze appreciated Abdominal: Soft and nontender. No distention. No guarding no rebound Back:  There is no focal tenderness or step off.  there is no midline tenderness there are no lesions noted. there is no CVA  tenderness Musculoskeletal: No lower extremity tenderness, no upper extremity tenderness. No joint effusions, no DVT signs strong distal pulses no edema Neurologic:  Normal speech and language. No gross focal neurologic deficits are appreciated.  Skin:  Skin is warm, dry and intact. No rash noted. Psychiatric: Mood and affect are anxious. Speech and behavior are normal.  ____________________________________________   LABS (all labs ordered are listed, but only abnormal results are displayed)  Labs Reviewed  CBC WITH DIFFERENTIAL/PLATELET - Abnormal; Notable for the following components:      Result Value   WBC 12.4 (*)    Neutro Abs 9.9 (*)    All other components within normal limits  COMPREHENSIVE METABOLIC PANEL - Abnormal; Notable for the following components:   Sodium  134 (*)    Potassium 3.4 (*)    Chloride 97 (*)    Glucose, Bld 134 (*)    All other components within normal limits  TROPONIN I  PROTIME-INR  LIPASE, BLOOD    Pertinent labs  results that were available during my care of the patient were reviewed by me and considered in my medical decision making (see chart for details). ____________________________________________  EKG  I personally interpreted any EKGs ordered by me or triage Sinus rhythm rate 91 bpm no acute ST elevation or depression, borderline LAD, no acute ischemic changes ____________________________________________  RADIOLOGY  Pertinent labs & imaging results that were available during my care of the patient were reviewed by me and considered in my medical decision making (see chart for details). If possible, patient and/or family made aware of any abnormal findings.  Dg Chest 2 View  Result Date: 06/02/2017 CLINICAL DATA:  Chest pain EXAM: CHEST  2 VIEW COMPARISON:  CT 07/29/2016, radiograph 09/20/2013 FINDINGS: Tiny left pleural effusion or thickening. Streaky opacity at the lingula and left base. Central airways thickening. Cardiomediastinal  silhouette within normal limits. Aortic atherosclerosis. No pneumothorax. IMPRESSION: 1. Tiny left pleural effusion or thickening with streaky atelectasis or minimal infiltrate at the lingula and left base 2. Moderate central airways thickening, could be secondary to bronchial inflammation. Electronically Signed   By: Donavan Foil M.D.   On: 06/02/2017 18:24   I personally reviewed both x-ray and CT ____________________________________________    PROCEDURES  Procedure(s) performed: None  Procedures  Critical Care performed: None  ____________________________________________   INITIAL IMPRESSION / ASSESSMENT AND PLAN / ED COURSE  Pertinent labs & imaging results that were available during my care of the patient were reviewed by me and considered in my medical decision making (see chart for details).  Patient here with cough and reproducible chest wall pain.  Blood pressure somewhat up but she is anxious and a little bit upset about being here.  Differential includes PE ACS dissection myocarditis endocarditis etc.  Also there is some question as to whether patient has a thoracic aneurysm although I suspect she likely does not.  Nonetheless patient does have some degree of pleuritic component to her chest pain, if she is short of breath she states, she is a cancer survivor, I feel that it would be in her best interest to have better imaging we will do a CT scan of her chest.  Patient and I did discuss the need for CT.  I did expand her with is not mandatory that she has a done, however, without it I cannot rule out certain entities that could certainly cause her great harm.  Patient understands and consents to the test which I do not think is unreasonable.  I also discussed with radiology prior to ordering the CT scan to ensure that a PE test could evaluate the aorta, which she feels that it can.  If that is negative it is my hope that we can get her home after repeat troponin as she has very  reproducible chest pain and cough.  ----------------------------------------- 8:14 PM on 06/02/2017 -----------------------------------------  She is 100% on room air, she only has pain when she coughs or when I touch her chest wall, she adamantly refuses admission to the hospital which is not unreasonable but certainly limits my options for taking care of her.  She understands these limitations and is very comfortable with it.  She is eager to go home.  She refuses prednisone because  it makes her feel bad.  She refuses offered pain medication for chest wall pain because she does not like that kind of pain medication.  She is not having any respiratory distress her lungs are clear and she only has pain when she coughs or changes position.  For this reason, I feel that she likely will be safe to go home after a second troponin.  We will send that as a precaution.  Her blood pressure somewhat elevated but patient is somewhat anxious, and she is I think asymptomatic with it.  Given that she refuses admission, we will see if we can get her rechecked by her primary care doctor.  Patient also advised that she needs to have her CT scan repeated once a year.  Patient very comfortable with this plan and is with family who also agrees.  They are awaiting second troponin prior to discharge.    ____________________________________________   FINAL CLINICAL IMPRESSION(S) / ED DIAGNOSES  Final diagnoses:  None      This chart was dictated using voice recognition software.  Despite best efforts to proofread,  errors can occur which can change meaning.      Schuyler Amor, MD 06/02/17 1846    Schuyler Amor, MD 06/02/17 2015

## 2017-06-02 NOTE — ED Triage Notes (Signed)
Pt presents to ED via [facility] with c/o substernal chest pain that radiates to her back, pt describes pain as a band feeling around her chest with pressure, pain is worse with deep inspiration. Pt also c/o SHOB x 1 week. Pt presents to ED from home. Pt has hx of AAA and thymoma, per EMS pt is in NSR, hypertensive with BP 183/109. Pt is alert and oriented on arrival.

## 2017-06-02 NOTE — ED Notes (Signed)
Pt taken to CT at this time.

## 2017-06-02 NOTE — ED Notes (Signed)
Pt returned from CT at this time.  

## 2017-06-02 NOTE — Discharge Instructions (Signed)
You  would prefer not to be admitted to the hospital which is not unreasonable.  Take over-the-counter pain medications for your pain.  Return to the emergency room if you have significant shortness of breath high fever difficulty breathing increased pain or you feel worse in any way.  We do notice that your aneurysm is not changed since 2016.  However, radiology does recommend that you have this rechecked once a year.  Please make your primary care doctor aware.  Your blood pressure was somewhat elevated here, please call your doctor for recheck tomorrow.

## 2017-07-06 ENCOUNTER — Ambulatory Visit (INDEPENDENT_AMBULATORY_CARE_PROVIDER_SITE_OTHER): Payer: Medicare HMO | Admitting: Internal Medicine

## 2017-07-06 ENCOUNTER — Encounter: Payer: Self-pay | Admitting: Internal Medicine

## 2017-07-06 VITALS — BP 138/86 | HR 85 | Ht 64.0 in | Wt 170.2 lb

## 2017-07-06 DIAGNOSIS — I7121 Aneurysm of the ascending aorta, without rupture: Secondary | ICD-10-CM

## 2017-07-06 DIAGNOSIS — F17200 Nicotine dependence, unspecified, uncomplicated: Secondary | ICD-10-CM

## 2017-07-06 DIAGNOSIS — Z0001 Encounter for general adult medical examination with abnormal findings: Secondary | ICD-10-CM

## 2017-07-06 DIAGNOSIS — G959 Disease of spinal cord, unspecified: Secondary | ICD-10-CM | POA: Diagnosis not present

## 2017-07-06 DIAGNOSIS — I10 Essential (primary) hypertension: Secondary | ICD-10-CM | POA: Diagnosis not present

## 2017-07-06 DIAGNOSIS — J432 Centrilobular emphysema: Secondary | ICD-10-CM | POA: Diagnosis not present

## 2017-07-06 DIAGNOSIS — Z0000 Encounter for general adult medical examination without abnormal findings: Secondary | ICD-10-CM

## 2017-07-06 DIAGNOSIS — M5136 Other intervertebral disc degeneration, lumbar region: Secondary | ICD-10-CM | POA: Diagnosis not present

## 2017-07-06 DIAGNOSIS — R69 Illness, unspecified: Secondary | ICD-10-CM | POA: Diagnosis not present

## 2017-07-06 DIAGNOSIS — G629 Polyneuropathy, unspecified: Secondary | ICD-10-CM | POA: Diagnosis not present

## 2017-07-06 DIAGNOSIS — I712 Thoracic aortic aneurysm, without rupture: Secondary | ICD-10-CM | POA: Diagnosis not present

## 2017-07-06 DIAGNOSIS — E041 Nontoxic single thyroid nodule: Secondary | ICD-10-CM | POA: Diagnosis not present

## 2017-07-06 DIAGNOSIS — E559 Vitamin D deficiency, unspecified: Secondary | ICD-10-CM | POA: Diagnosis not present

## 2017-07-06 DIAGNOSIS — Z8742 Personal history of other diseases of the female genital tract: Secondary | ICD-10-CM

## 2017-07-06 DIAGNOSIS — Z1239 Encounter for other screening for malignant neoplasm of breast: Secondary | ICD-10-CM

## 2017-07-06 HISTORY — DX: Polyneuropathy, unspecified: G62.9

## 2017-07-06 LAB — POCT URINALYSIS DIPSTICK
BILIRUBIN UA: NEGATIVE
Glucose, UA: NEGATIVE
KETONES UA: NEGATIVE
Leukocytes, UA: NEGATIVE
NITRITE UA: NEGATIVE
PH UA: 6 (ref 5.0–8.0)
Protein, UA: NEGATIVE
RBC UA: NEGATIVE
SPEC GRAV UA: 1.01 (ref 1.010–1.025)
UROBILINOGEN UA: 0.2 U/dL

## 2017-07-06 MED ORDER — DICLOFENAC SODIUM 50 MG PO TBEC: 50.0000 mg | DELAYED_RELEASE_TABLET | Freq: Two times a day (BID) | ORAL | 12 refills | Status: DC

## 2017-07-06 NOTE — Patient Instructions (Addendum)
Aspirin 81 mg coated enteric coated three times per week Breast Self-Awareness Breast self-awareness means being familiar with how your breasts look and feel. It involves checking your breasts regularly and reporting any changes to your health care provider. Practicing breast self-awareness is important. A change in your breasts can be a sign of a serious medical problem. Being familiar with how your breasts look and feel allows you to find any problems early, when treatment is more likely to be successful. All women should practice breast self-awareness, including women who have had breast implants. How to do a breast self-exam One way to learn what is normal for your breasts and whether your breasts are changing is to do a breast self-exam. To do a breast self-exam: Look for Changes  1. Remove all the clothing above your waist. 2. Stand in front of a mirror in a room with good lighting. 3. Put your hands on your hips. 4. Push your hands firmly downward. 5. Compare your breasts in the mirror. Look for differences between them (asymmetry), such as: ? Differences in shape. ? Differences in size. ? Puckers, dips, and bumps in one breast and not the other. 6. Look at each breast for changes in your skin, such as: ? Redness. ? Scaly areas. 7. Look for changes in your nipples, such as: ? Discharge. ? Bleeding. ? Dimpling. ? Redness. ? A change in position. Feel for Changes  Carefully feel your breasts for lumps and changes. It is best to do this while lying on your back on the floor and again while sitting or standing in the shower or tub with soapy water on your skin. Feel each breast in the following way:  Place the arm on the side of the breast you are examining above your head.  Feel your breast with the other hand.  Start in the nipple area and make  inch (2 cm) overlapping circles to feel your breast. Use the pads of your three middle fingers to do this. Apply light pressure, then  medium pressure, then firm pressure. The light pressure will allow you to feel the tissue closest to the skin. The medium pressure will allow you to feel the tissue that is a little deeper. The firm pressure will allow you to feel the tissue close to the ribs.  Continue the overlapping circles, moving downward over the breast until you feel your ribs below your breast.  Move one finger-width toward the center of the body. Continue to use the  inch (2 cm) overlapping circles to feel your breast as you move slowly up toward your collarbone.  Continue the up and down exam using all three pressures until you reach your armpit.  Write Down What You Find  Write down what is normal for each breast and any changes that you find. Keep a written record with breast changes or normal findings for each breast. By writing this information down, you do not need to depend only on memory for size, tenderness, or location. Write down where you are in your menstrual cycle, if you are still menstruating. If you are having trouble noticing differences in your breasts, do not get discouraged. With time you will become more familiar with the variations in your breasts and more comfortable with the exam. How often should I examine my breasts? Examine your breasts every month. If you are breastfeeding, the best time to examine your breasts is after a feeding or after using a breast pump. If you menstruate, the best  time to examine your breasts is 5-7 days after your period is over. During your period, your breasts are lumpier, and it may be more difficult to notice changes. When should I see my health care provider? See your health care provider if you notice:  A change in shape or size of your breasts or nipples.  A change in the skin of your breast or nipples, such as a reddened or scaly area.  Unusual discharge from your nipples.  A lump or thick area that was not there before.  Pain in your breasts.  Anything  that concerns you.  This information is not intended to replace advice given to you by your health care provider. Make sure you discuss any questions you have with your health care provider. Document Released: 03/24/2005 Document Revised: 08/30/2015 Document Reviewed: 02/11/2015 Elsevier Interactive Patient Education  [employer].

## 2017-07-06 NOTE — Progress Notes (Signed)
Date:  07/06/2017   Name:  Chelsea Santana   DOB:  1950-11-04   MRN:  [id number]  Medicare was effective today.  Chief Complaint: Annual Exam (Breast Exam. Papsmear. ) Chelsea Santana is a 67 y.o. female who presents today for her Complete Annual Exam. She feels fairly well. She reports exercising none. She reports she is sleeping fairly well.  Last Pap 2015 was neg. She had last mammogram in 2016 and does not want any further. She declines all vaccinations, including inflenza and pneumococcal.   Hypertension  This is a chronic problem. The problem is unchanged. The problem is controlled (at home BP is 135/80). Associated symptoms include shortness of breath. Pertinent negatives include no chest pain, headaches or palpitations. Risk factors for coronary artery disease include smoking/tobacco exposure. Past treatments include angiotensin blockers (was on losartan but caused dizziness). Compliance problems include medication side effects.   Hyperlipidemia  This is a chronic problem. The problem is uncontrolled. Associated symptoms include shortness of breath. Pertinent negatives include no chest pain. Current antihyperlipidemic treatment includes herbal therapy. Compliance problems include medication side effects.   COPD - stable sx on Proair,  Still smoking and not ready to quit.  Thoracic aneurysm - last studied 05/2017: Stable borderline aneurysmal dilatation of the ascending thoracic aorta, measuring 4.0 cm. Recommend annual imaging followup by CTA or MRA.  Done when in ED with chest wall pain.  Thyroid nodule -  Seen by Dr. Pryor Ochoa several years ago.  Needed to have [place] follow up but insurance did not cover it.  Now she has better coverage. 06/2015 CT chest: Note is made of a punctate (approximately 0.7 cm) hypo attenuating nodule within the anterior aspect of the right lobe of the thyroid  Back pain/neuropathy - having more numbness in feet.  Also has spinal cord lesion that was seen at [facility] -  did not get follow up for insurance reasons.  Now needs to be seen again.  OA knees - taking voltaren 75 mg bid but insurance only covers 50 mg.  Needs new Rx.  Review of Systems  Constitutional: Positive for fatigue. Negative for chills and fever.  HENT: Negative for congestion, hearing loss, tinnitus, trouble swallowing and voice change.   Eyes: Negative for visual disturbance.  Respiratory: Positive for shortness of breath. Negative for cough, chest tightness and wheezing.   Cardiovascular: Negative for chest pain, palpitations and leg swelling.  Gastrointestinal: Negative for abdominal pain, constipation, diarrhea and vomiting.  Endocrine: Negative for polydipsia and polyuria.  Genitourinary: Negative for dysuria, frequency, genital sores, vaginal bleeding and vaginal discharge.  Musculoskeletal: Positive for arthralgias and back pain. Negative for gait problem and joint swelling.  Skin: Negative for color change and rash.  Allergic/Immunologic: Negative for environmental allergies and food allergies.  Neurological: Positive for numbness (in both feet, worsening). Negative for dizziness, tremors, light-headedness and headaches.  Hematological: Negative for adenopathy. Does not bruise/bleed easily.  Psychiatric/Behavioral: Positive for dysphoric mood and sleep disturbance. The patient is nervous/anxious.     Patient Active Problem List   Diagnosis Date Noted  . Thyroid nodule, uninodular 05/21/2016  . Plantar fasciitis 03/11/2016  . Ascending aortic aneurysm ([place]) 05/20/2015  . Coronary artery calcification seen on CT scan 05/20/2015  . Disease of spinal cord ([place]) 03/06/2015  . Degeneration of intervertebral disc of lumbar region 02/17/2015  . H/O abnormal cervical Papanicolaou smear 02/17/2015  . Compulsive tobacco user syndrome 02/17/2015  . Avitaminosis D 02/17/2015  . Thymoma 07/17/2014  .  Chronic obstructive pulmonary disease ([place]) 05/25/2014  . Leg weakness 04/14/2013  .  Cardiac murmur 08/31/2012  . Essential hypertension 08/31/2012  . CA of skin 08/31/2012    Prior to Admission medications   Medication Sig Start Date End Date Taking? Authorizing Provider  baclofen (LIORESAL) 10 MG tablet Take 1 tablet (10 mg total) by mouth 3 (three) times daily. Patient taking differently: Take 5 mg by mouth 3 (three) times daily.  04/08/16   [name], MD  Cholecalciferol (VITAMIN D) 2000 UNITS CAPS Take 1 capsule by mouth daily.    [provider]  diclofenac (VOLTAREN) 75 MG EC tablet TAKE ONE TABLET BY MOUTH TWICE DAILY 01/01/17   [name], MD  Multiple Vitamins-Minerals (WOMENS 50+ Stone VITAMIN/MIN) TABS Take by mouth.    [provider]  OMEGA-3 FATTY ACIDS PO Take by mouth.    [provider]  PROAIR HFA 108 [phone] Base) MCG/ACT inhaler INHALE 2 PUFFS INTO LUNGS EVERY 6 HOURS AS NEEDED FOR WHEEZING OR SHORTNESS OF BREATH 03/10/16   [name], MD    Allergies  Allergen Reactions  . Antihistamines, Chlorpheniramine-Type Other (See Comments)    Makes her feel high  . Oxymetazoline Other (See Comments)    Makes her "high"  . Prednisone     lethargy and anger  . Statins Other (See Comments)    Muscle Cramps   . Lipoic Acid Other (See Comments)    Numbness in hands    Past Surgical History:  Procedure Laterality Date  . CERVICAL CONE BIOPSY    . KNEE ARTHROSCOPY Bilateral   . THYMECTOMY  07/2014  . TONSILLECTOMY    . TUBAL LIGATION      Social History   Tobacco Use  . Smoking status: Current Every Day Smoker    Packs/day: 1.00    Years: 48.00    Pack years: 48.00    Types: Cigarettes  . Smokeless tobacco: Never Used  Substance Use Topics  . Alcohol use: No    Alcohol/week: 0.0 oz  . Drug use: No     Medication list has been reviewed and updated.  PHQ 2/9 Scores 05/15/2017 10/03/2015 09/18/2015  PHQ - 2 Score 0 0 0    Physical Exam  Constitutional: She is oriented to person, place, and time. She  appears well-developed and well-nourished. No distress.  HENT:  Head: Normocephalic and atraumatic.  Right Ear: Tympanic membrane and ear canal normal.  Left Ear: Tympanic membrane and ear canal normal.  Nose: Right sinus exhibits no maxillary sinus tenderness. Left sinus exhibits no maxillary sinus tenderness.  Mouth/Throat: Uvula is midline and oropharynx is clear and moist.  Eyes: Conjunctivae and EOM are normal. Right eye exhibits no discharge. Left eye exhibits no discharge. No scleral icterus.  Neck: Normal range of motion. No tracheal tenderness present. Carotid bruit is not present. No erythema present. No thyromegaly present.  Cardiovascular: Normal rate, regular rhythm, normal heart sounds and normal pulses.  Pulmonary/Chest: Effort normal. No respiratory distress. She has no wheezes. Right breast exhibits no mass, no nipple discharge, no skin change and no tenderness. Left breast exhibits no mass, no nipple discharge, no skin change and no tenderness.  Abdominal: Soft. Bowel sounds are normal. There is no hepatosplenomegaly. There is no tenderness. There is no CVA tenderness.  Genitourinary: Vagina normal and uterus normal. There is no rash, tenderness or lesion on the right labia. There is no rash, tenderness or lesion on the left labia. Cervix  exhibits no motion tenderness, no discharge and no friability. Right adnexum displays no mass, no tenderness and no fullness. Left adnexum displays no mass, no tenderness and no fullness.  Musculoskeletal: Normal range of motion.  Lymphadenopathy:    She has no cervical adenopathy.    She has no axillary adenopathy.  Neurological: She is alert and oriented to person, place, and time. She has normal reflexes. No cranial nerve deficit or sensory deficit.  Skin: Skin is warm, dry and intact. No rash noted.  Psychiatric: She has a normal mood and affect. Her speech is normal and behavior is normal. Thought content normal.  Nursing note and vitals  reviewed.   BP 138/86   Pulse 85   Ht 5\' 4"  (1.626 m)   Wt 170 lb 3.2 oz (77.2 kg)   SpO2 99%   BMI 29.21 kg/m   Assessment and Plan: 1. Annual physical exam - CBC with Differential/Platelet - Comprehensive metabolic panel - Lipid panel - TSH - POCT urinalysis dipstick - HIV antibody  2. Breast cancer screening Patient declines mammograms I encouraged regular self exams  3. H/O abnormal cervical Papanicolaou smear Done today - should be the last - Pap IG and HPV (high risk) DNA detection  4. Essential hypertension Not on medication due to side effects Normal today and pt says normal at home - continue to monitor off of medication  5. Ascending aortic aneurysm (HCC) Stable; pt declines antihypertensives to reduce risk of CVA/AAA rupture Discussed role of tobacco in PAD Begin ECASA 81 mg tiw  6. Centrilobular emphysema (HCC) Stable Continue inhalers  7. Compulsive tobacco user syndrome Not interested in quitting assistance today  8. Degeneration of intervertebral disc of lumbar region Needs referral back to [facility]  9. Neuropathy Worsening; possible related to disc disease - Vitamin B12  10. Vitamin D deficiency Will advise on supplementation - VITAMIN D 25 Hydroxy (Vit-D Deficiency, Fractures)  11. Disease of spinal cord ([place]) Refer back to [facility]  12. Thyroid nodule, uninodular Will get repeat US and refer if biopsy needed - US THYROID; Future  No orders of the defined types were placed in this encounter.   Partially dictated using Editor, commissioning. Any errors are unintentional.  Halina Maidens, MD [facility]  07/06/2017

## 2017-07-07 LAB — COMPREHENSIVE METABOLIC PANEL
ALBUMIN: 4.8 g/dL (ref 3.6–4.8)
ALT: 17 IU/L (ref 0–32)
AST: 18 IU/L (ref 0–40)
Albumin/Globulin Ratio: 1.7 (ref 1.2–2.2)
Alkaline Phosphatase: 70 IU/L (ref 39–117)
BILIRUBIN TOTAL: 0.3 mg/dL (ref 0.0–1.2)
BUN / CREAT RATIO: 12 (ref 12–28)
BUN: 8 mg/dL (ref 8–27)
CHLORIDE: 97 mmol/L (ref 96–106)
CO2: 18 mmol/L — ABNORMAL LOW (ref 20–29)
Calcium: 9.9 mg/dL (ref 8.7–10.3)
Creatinine, Ser: 0.68 mg/dL (ref 0.57–1.00)
GFR calc non Af Amer: 93 mL/min/{1.73_m2} (ref >59)
GFR, EST AFRICAN AMERICAN: 107 mL/min/{1.73_m2} (ref >59)
GLUCOSE: 79 mg/dL (ref 65–99)
Globulin, Total: 2.8 g/dL (ref 1.5–4.5)
POTASSIUM: 5 mmol/L (ref 3.5–5.2)
Sodium: 140 mmol/L (ref 134–144)
TOTAL PROTEIN: 7.6 g/dL (ref 6.0–8.5)

## 2017-07-07 LAB — CBC WITH DIFFERENTIAL/PLATELET
BASOS ABS: 0 10*3/uL (ref 0.0–0.2)
BASOS: 0 %
EOS (ABSOLUTE): 0.2 10*3/uL (ref 0.0–0.4)
Eos: 2 %
HEMOGLOBIN: 13.3 g/dL (ref 11.1–15.9)
Hematocrit: 39.9 % (ref 34.0–46.6)
IMMATURE GRANS (ABS): 0 10*3/uL (ref 0.0–0.1)
Immature Granulocytes: 0 %
LYMPHS ABS: 2.5 10*3/uL (ref 0.7–3.1)
LYMPHS: 24 %
MCH: 30.2 pg (ref 26.6–33.0)
MCHC: 33.3 g/dL (ref 31.5–35.7)
MCV: 91 fL (ref 79–97)
Monocytes Absolute: 0.6 10*3/uL (ref 0.1–0.9)
Monocytes: 6 %
NEUTROS ABS: 6.9 10*3/uL (ref 1.4–7.0)
Neutrophils: 68 %
PLATELETS: 353 10*3/uL (ref 150–379)
RBC: 4.4 x10E6/uL (ref 3.77–5.28)
RDW: 14.2 % (ref 12.3–15.4)
WBC: 10.3 10*3/uL (ref 3.4–10.8)

## 2017-07-07 LAB — HIV ANTIBODY (ROUTINE TESTING W REFLEX): HIV SCREEN 4TH GENERATION: NONREACTIVE

## 2017-07-07 LAB — LIPID PANEL
Chol/HDL Ratio: 3.4 ratio (ref 0.0–4.4)
Cholesterol, Total: 233 mg/dL — ABNORMAL HIGH (ref 100–199)
HDL: 69 mg/dL (ref >39)
LDL Calculated: 134 mg/dL — ABNORMAL HIGH (ref 0–99)
Triglycerides: 148 mg/dL (ref 0–149)
VLDL Cholesterol Cal: 30 mg/dL (ref 5–40)

## 2017-07-07 LAB — VITAMIN D 25 HYDROXY (VIT D DEFICIENCY, FRACTURES): VIT D 25 HYDROXY: 39.7 ng/mL (ref 30.0–100.0)

## 2017-07-07 LAB — TSH: TSH: 1.02 u[IU]/mL (ref 0.450–4.500)

## 2017-07-07 LAB — VITAMIN B12: VITAMIN B 12: 883 pg/mL (ref 232–1245)

## 2017-07-08 LAB — PAP IG AND HPV HIGH-RISK
HPV, high-risk: NEGATIVE
PAP Smear Comment: 0

## 2017-07-08 LAB — SPECIMEN STATUS REPORT

## 2017-07-13 ENCOUNTER — Ambulatory Visit
Admission: RE | Admit: 2017-07-13 | Discharge: 2017-07-13 | Disposition: A | Discharge status: Home / Self Care | Payer: Medicare HMO | Source: Ambulatory Visit | Attending: Internal Medicine | Admitting: Internal Medicine

## 2017-07-13 ENCOUNTER — Encounter: Payer: Self-pay | Admitting: Internal Medicine

## 2017-07-13 DIAGNOSIS — E041 Nontoxic single thyroid nodule: Secondary | ICD-10-CM

## 2017-07-13 DIAGNOSIS — E042 Nontoxic multinodular goiter: Secondary | ICD-10-CM | POA: Insufficient documentation

## 2017-07-27 NOTE — Progress Notes (Signed)
[place]  [phone] [phone]) [phone]  ID: Chelsea Santana OB: 12/14/1950  MR#: [id number]  JJH#:[id number]  Patient Care Team: [name], MD as PCP - General (Internal Medicine) [name], MD as Consulting Physician (Cardiology) [name], MD as Referring Physician (Otolaryngology) [name], MD as Referring Physician (Neurosurgery)  CHIEF COMPLAINT: Stage IIa thymoma.  INTERVAL HISTORY: Patient returns to clinic today for further evaluation and discussion of her imaging results.  She had acute onset chest pain in February 2019 and underwent CT scan at that point which were revealed no evidence of disease.  She currently feels well and is back to her baseline. She has no neurologic complaints. She denies any recent fevers or illnesses. She has a good appetite and denies weight loss. She has no further chest pain or shortness of breath. She denies any nausea, vomiting, constipation, or diarrhea. She has no urinary complaints.  Patient offers no specific complaints today.  REVIEW OF SYSTEMS:   Review of Systems  Constitutional: Negative.  Negative for fever, malaise/fatigue and weight loss.  Respiratory: Negative.  Negative for cough, hemoptysis and shortness of breath.   Cardiovascular: Negative.  Negative for chest pain and leg swelling.  Gastrointestinal: Negative.  Negative for abdominal pain.  Genitourinary: Negative.   Musculoskeletal: Negative.   Skin: Negative.  Negative for rash.  Neurological: Negative.  Negative for sensory change, focal weakness and weakness.  Psychiatric/Behavioral: Negative.  The patient is not nervous/anxious.     As per HPI. Otherwise, a complete review of systems is negative.  PAST MEDICAL HISTORY: Past Medical History:  Diagnosis Date  . Cancer (HCC)    Thymoma, Stage II  . Hypertension     PAST SURGICAL HISTORY: Past Surgical History:  Procedure Laterality Date  . CERVICAL CONE  BIOPSY    . KNEE ARTHROSCOPY Bilateral   . THYMECTOMY  07/2014  . TONSILLECTOMY    . TUBAL LIGATION      FAMILY HISTORY Family History  Problem Relation Age of Onset  . Multiple myeloma Mother   . Lung cancer Father   . Diabetes Sister   . Diabetes Brother        ADVANCED DIRECTIVES:    HEALTH MAINTENANCE: Social History   Tobacco Use  . Smoking status: Current Every Day Smoker    Packs/day: 1.00    Years: 48.00    Pack years: 48.00    Types: Cigarettes  . Smokeless tobacco: Never Used  Substance Use Topics  . Alcohol use: No    Alcohol/week: 0.0 oz  . Drug use: No     Colonoscopy:  PAP:  Bone density:  Lipid panel:  Allergies  Allergen Reactions  . Antihistamines, Chlorpheniramine-Type Other (See Comments)    Makes her feel high  . Oxymetazoline Other (See Comments)    Makes her "high"  . Prednisone     lethargy and anger  . Statins Other (See Comments)    Muscle Cramps   . Lipoic Acid Other (See Comments)    Numbness in hands    Current Outpatient Medications  Medication Sig Dispense Refill  . baclofen (LIORESAL) 10 MG tablet Take 1 tablet (10 mg total) by mouth 3 (three) times daily. (Patient taking differently: Take 5 mg by mouth as needed. ) 270 tablet 1  . Cholecalciferol (VITAMIN D) 2000 UNITS CAPS Take 1 capsule by mouth daily.    . diclofenac (VOLTAREN) 50 MG EC tablet Take 1 tablet (50 mg total) by mouth 2 (  two) times daily. 60 tablet 12  . Ferrous Sulfate (SLOW FE PO) Take 45 mg by mouth every other day.    . Multiple Vitamins-Minerals (WOMENS 50+ MULTI VITAMIN/MIN) TABS Take by mouth.    . OMEGA-3 FATTY ACIDS PO Take by mouth.    Marland Kitchen PROAIR HFA 108 (90 Base) MCG/ACT inhaler INHALE 2 PUFFS INTO LUNGS EVERY 6 HOURS AS NEEDED FOR WHEEZING OR SHORTNESS OF BREATH 8.5 Inhaler 0   No current facility-administered medications for this visit.     OBJECTIVE: Vitals:   07/31/17 1040  BP: (!) 159/100  Pulse: 90  Resp: 18  Temp: (!) 97.5 F (36.4  C)     Body mass index is 28.53 kg/m.    ECOG FS:0 - Asymptomatic  General: Well-developed, well-nourished, no acute distress. Eyes: Pink conjunctiva, anicteric sclera. Lungs: Clear to auscultation bilaterally. Heart: Regular rate and rhythm. No rubs, murmurs, or gallops. Abdomen: Soft, nontender, nondistended. No organomegaly noted, normoactive bowel sounds. Musculoskeletal: No edema, cyanosis, or clubbing. Neuro: Alert, answering all questions appropriately. Cranial nerves grossly intact. Skin: No rashes or petechiae noted. Psych: Normal affect.  LAB RESULTS:  Lab Results  Component Value Date   NA 140 07/06/2017   K 5.0 07/06/2017   CL 97 07/06/2017   CO2 18 (L) 07/06/2017   GLUCOSE 79 07/06/2017   BUN 8 07/06/2017   CREATININE 0.68 07/06/2017   CALCIUM 9.9 07/06/2017   PROT 7.6 07/06/2017   ALBUMIN 4.8 07/06/2017   AST 18 07/06/2017   ALT 17 07/06/2017   ALKPHOS 70 07/06/2017   BILITOT 0.3 07/06/2017   GFRNONAA 93 07/06/2017   GFRAA 107 07/06/2017    Lab Results  Component Value Date   WBC 10.3 07/06/2017   NEUTROABS 6.9 07/06/2017   HGB 13.3 07/06/2017   HCT 39.9 07/06/2017   MCV 91 07/06/2017   PLT 353 07/06/2017     STUDIES: US Thyroid  Result Date: 07/13/2017 CLINICAL DATA:  Thyroid nodule by CT EXAM: THYROID ULTRASOUND TECHNIQUE: Ultrasound examination of the thyroid gland and adjacent soft tissues was performed. COMPARISON:  06/02/2017 FINDINGS: Parenchymal Echotexture: Normal Isthmus: 4 mm Right lobe: 5.3 x 2.1 x 1.7 cm Left lobe: 5.0 x 1.8 x 1.8 cm _________________________________________________________ Estimated total number of nodules >/= 1 cm: 2 Number of spongiform nodules >/=  2 cm not described below (TR1): 0 Number of mixed cystic and solid nodules >/= 1.5 cm not described below ([place]): 0 _________________________________________________________ Nodule # 2: Location: Right; Inferior Maximum size: 1.4 cm; Other 2 dimensions: 1.1 x 0.8 cm  Composition: solid/almost completely solid (2) Echogenicity: isoechoic (1) Shape: not taller-than-wide (0) Margins: ill-defined (0) Echogenic foci: punctate echogenic foci (3) ACR TI-RADS total points: 6. ACR TI-RADS risk category: TR4 (4-6 points). ACR TI-RADS recommendations: *Given size (>/= 1 - 1.4 cm) and appearance, a follow-up ultrasound in 1 year should be considered based on TI-RADS criteria. _________________________________________________________ Nodule # 3: Location: Left; Inferior Maximum size: 1.2 cm; Other 2 dimensions: 0.9 x 0.8 cm Composition: solid/almost completely solid (2) Echogenicity: hypoechoic (2) Shape: not taller-than-wide (0) Margins: ill-defined (0) Echogenic foci: none (0) ACR TI-RADS total points: 4. ACR TI-RADS risk category: TR4 (4-6 points). ACR TI-RADS recommendations: *Given size (>/= 1 - 1.4 cm) and appearance, a follow-up ultrasound in 1 year should be considered based on TI-RADS criteria. _________________________________________________________ Additional isthmic hypoechoic nodule only measures 8 mm. This would not meet criteria for any biopsy or follow-up. No adenopathy. IMPRESSION: Dominant TR 4 nodules noted bilaterally which  meet criteria for follow-up in 1 year. Currently no biopsy indicated. The above is in keeping with the ACR TI-RADS recommendations - J Am Coll Radiol 2017;14:587-595. Electronically Signed   By: Jerilynn Mages.  Shick M.D.   On: 07/13/2017 12:26    ASSESSMENT: Stage IIa thymoma.  PLAN:    1. Thymoma: Patient's laboratory work, imaging, and consult notes from outside facility were previously reviewed extensively and independently.  Patient was diagnosed in April 2016. Surgical margins reported as negative with only microscopic transcapsular invasion. Patient was also evaluated by radiation oncology at that time and it was determined that XRT was not necessary.  CT scan results from June 02, 2017 reviewed independently with no evidence of recurrent or  progressive disease.  After lengthy discussion with the patient, she has requested no further follow-up.  No further interventions needed.  Please refer patient back if there are any questions or concerns.  Approximately 20 minutes was spent in discussion of which greater than 50% was consultation.   Patient expressed understanding and was in agreement with this plan. She also understands that She can call clinic at any time with any questions, concerns, or complaints.   Chelsea Huger, MD   08/02/2017 7:29 AM

## 2017-07-28 ENCOUNTER — Ambulatory Visit: Payer: BLUE CROSS/BLUE SHIELD

## 2017-07-30 DIAGNOSIS — M47814 Spondylosis without myelopathy or radiculopathy, thoracic region: Secondary | ICD-10-CM | POA: Diagnosis not present

## 2017-07-30 DIAGNOSIS — G959 Disease of spinal cord, unspecified: Secondary | ICD-10-CM | POA: Diagnosis not present

## 2017-07-31 ENCOUNTER — Inpatient Hospital Stay: Payer: Medicare HMO | Attending: Oncology | Admitting: Oncology

## 2017-07-31 ENCOUNTER — Encounter: Payer: Self-pay | Admitting: Oncology

## 2017-07-31 VITALS — BP 159/100 | HR 90 | Temp 97.5°F | Resp 18 | Wt 166.2 lb

## 2017-07-31 DIAGNOSIS — D4989 Neoplasm of unspecified behavior of other specified sites: Secondary | ICD-10-CM

## 2017-07-31 DIAGNOSIS — D150 Benign neoplasm of thymus: Secondary | ICD-10-CM | POA: Insufficient documentation

## 2017-07-31 DIAGNOSIS — Z720 Tobacco use: Secondary | ICD-10-CM

## 2017-07-31 NOTE — Progress Notes (Signed)
Patient here today for follow up, denies concerns.  

## 2017-08-04 DIAGNOSIS — G959 Disease of spinal cord, unspecified: Secondary | ICD-10-CM | POA: Diagnosis not present

## 2017-08-04 DIAGNOSIS — G6289 Other specified polyneuropathies: Secondary | ICD-10-CM | POA: Diagnosis not present

## 2017-08-25 DIAGNOSIS — G6289 Other specified polyneuropathies: Secondary | ICD-10-CM | POA: Diagnosis not present

## 2017-09-30 ENCOUNTER — Other Ambulatory Visit: Payer: Self-pay

## 2017-09-30 MED ORDER — BACLOFEN 10 MG PO TABS: 10.0000 mg | ORAL_TABLET | Freq: Three times a day (TID) | ORAL | 5 refills | Status: DC

## 2017-09-30 NOTE — Telephone Encounter (Signed)
Patient called wanting refill on Baclofen 10 mg for BID Patient also needs Diclofenac 50 mg TID  Please advise medication changes. Last refilled  Baclofen: 04/08/2016 10mg  TID PRN  Diclofenac: 07/06/2017  50 mg  BID  LOV  07/06/2017

## 2017-10-12 ENCOUNTER — Encounter: Payer: Self-pay | Admitting: Internal Medicine

## 2017-10-12 ENCOUNTER — Ambulatory Visit: Payer: Medicare HMO | Admitting: Internal Medicine

## 2017-10-12 ENCOUNTER — Ambulatory Visit
Admission: RE | Admit: 2017-10-12 | Discharge: 2017-10-12 | Disposition: A | Discharge status: Home / Self Care | Payer: Medicare HMO | Source: Ambulatory Visit | Attending: Internal Medicine | Admitting: Internal Medicine

## 2017-10-12 VITALS — BP 154/82 | HR 96 | Resp 16 | Ht 64.0 in | Wt 165.8 lb

## 2017-10-12 DIAGNOSIS — M545 Low back pain: Secondary | ICD-10-CM | POA: Diagnosis not present

## 2017-10-12 DIAGNOSIS — G959 Disease of spinal cord, unspecified: Secondary | ICD-10-CM | POA: Diagnosis not present

## 2017-10-12 DIAGNOSIS — M5416 Radiculopathy, lumbar region: Secondary | ICD-10-CM

## 2017-10-12 DIAGNOSIS — N3000 Acute cystitis without hematuria: Secondary | ICD-10-CM

## 2017-10-12 DIAGNOSIS — M5116 Intervertebral disc disorders with radiculopathy, lumbar region: Secondary | ICD-10-CM | POA: Diagnosis not present

## 2017-10-12 DIAGNOSIS — K5904 Chronic idiopathic constipation: Secondary | ICD-10-CM | POA: Diagnosis not present

## 2017-10-12 DIAGNOSIS — M8938 Hypertrophy of bone, other site: Secondary | ICD-10-CM | POA: Insufficient documentation

## 2017-10-12 DIAGNOSIS — M4316 Spondylolisthesis, lumbar region: Secondary | ICD-10-CM | POA: Diagnosis not present

## 2017-10-12 DIAGNOSIS — J449 Chronic obstructive pulmonary disease, unspecified: Secondary | ICD-10-CM | POA: Diagnosis not present

## 2017-10-12 DIAGNOSIS — I10 Essential (primary) hypertension: Secondary | ICD-10-CM | POA: Diagnosis not present

## 2017-10-12 MED ORDER — AMOXICILLIN 875 MG PO TABS: 875.0000 mg | ORAL_TABLET | Freq: Two times a day (BID) | ORAL | 0 refills | Status: DC

## 2017-10-12 MED ORDER — DOCUSATE SODIUM 100 MG PO CAPS: 100.0000 mg | ORAL_CAPSULE | Freq: Two times a day (BID) | ORAL | 5 refills | Status: DC
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
## 2018-01-28 DIAGNOSIS — R69 Illness, unspecified: Secondary | ICD-10-CM | POA: Diagnosis not present

## 2018-06-30 ENCOUNTER — Telehealth: Payer: Self-pay | Admitting: Internal Medicine

## 2018-06-30 NOTE — Telephone Encounter (Signed)
Spoke with patient and she authorized her AWV with [facility] on 07/05/2018 at 10:40 AM to be done telephonically and please call her at # [phone].  Patient is notified to pleased have a list of all current medications, a list of all providers currently seeing and to obtain and write down her weight, BP, Pulse-Ox, and temperature prior to the visit.  Patient voiced understood.  Janace Hoard, Care Guide.

## 2018-07-01 ENCOUNTER — Ambulatory Visit: Payer: BLUE CROSS/BLUE SHIELD

## 2018-07-05 ENCOUNTER — Ambulatory Visit: Payer: Self-pay

## 2018-07-07 ENCOUNTER — Other Ambulatory Visit: Payer: Self-pay

## 2018-07-08 ENCOUNTER — Ambulatory Visit (INDEPENDENT_AMBULATORY_CARE_PROVIDER_SITE_OTHER): Payer: Medicare HMO | Admitting: Internal Medicine

## 2018-07-08 ENCOUNTER — Encounter: Payer: Self-pay | Admitting: Internal Medicine

## 2018-07-08 VITALS — BP 136/82 | HR 78 | Ht 64.0 in | Wt 167.0 lb

## 2018-07-08 DIAGNOSIS — E042 Nontoxic multinodular goiter: Secondary | ICD-10-CM | POA: Diagnosis not present

## 2018-07-08 DIAGNOSIS — Z23 Encounter for immunization: Secondary | ICD-10-CM | POA: Diagnosis not present

## 2018-07-08 DIAGNOSIS — J432 Centrilobular emphysema: Secondary | ICD-10-CM | POA: Diagnosis not present

## 2018-07-08 DIAGNOSIS — E559 Vitamin D deficiency, unspecified: Secondary | ICD-10-CM

## 2018-07-08 DIAGNOSIS — Z0000 Encounter for general adult medical examination without abnormal findings: Secondary | ICD-10-CM | POA: Diagnosis not present

## 2018-07-08 DIAGNOSIS — G959 Disease of spinal cord, unspecified: Secondary | ICD-10-CM

## 2018-07-08 DIAGNOSIS — Z1231 Encounter for screening mammogram for malignant neoplasm of breast: Secondary | ICD-10-CM | POA: Diagnosis not present

## 2018-07-08 DIAGNOSIS — I712 Thoracic aortic aneurysm, without rupture, unspecified: Secondary | ICD-10-CM | POA: Insufficient documentation

## 2018-07-08 DIAGNOSIS — Z1211 Encounter for screening for malignant neoplasm of colon: Secondary | ICD-10-CM

## 2018-07-08 DIAGNOSIS — I10 Essential (primary) hypertension: Secondary | ICD-10-CM | POA: Diagnosis not present

## 2018-07-08 LAB — POCT URINALYSIS DIPSTICK
Bilirubin, UA: NEGATIVE
Blood, UA: NEGATIVE
Glucose, UA: NEGATIVE
Ketones, UA: NEGATIVE
Leukocytes, UA: NEGATIVE
Nitrite, UA: NEGATIVE
Protein, UA: NEGATIVE
Spec Grav, UA: <=1.005 — AB (ref 1.010–1.025)
Urobilinogen, UA: 0.2 E.U./dL
pH, UA: 6 (ref 5.0–8.0)

## 2018-07-08 MED ORDER — DICLOFENAC SODIUM 50 MG PO TBEC: 50.0000 mg | DELAYED_RELEASE_TABLET | Freq: Three times a day (TID) | ORAL | 1 refills | Status: DC

## 2018-07-08 NOTE — Progress Notes (Signed)
Date:  07/08/2018   Name:  Chelsea Santana   DOB:  [birth year]   MRN:  [id number]   Chief Complaint: Annual Exam (Breast Exam and OPTUM FORM) Chelsea Santana is a 68 y.o. female who presents today for her Complete Annual Exam. She feels fairly well. She reports exercising none. She reports she is sleeping fairly well. She has recently taken in a 66 y/o as legal guardian. Patient does not get mammograms, CRC screening or recommended immunizations.  She does agree today to a FOBT test. Last Pap - 2019 normal with negative HPV Hypertension  This is a chronic problem. The problem is unchanged. The problem is uncontrolled. Associated symptoms include shortness of breath (with extreme exertion). Pertinent negatives include no chest pain, headaches or palpitations. Past treatments include nothing (she took one medication in the past but had side effects (unknown).). Identifiable causes of hypertension include a thyroid problem.  Thyroid Problem  Presents for follow-up visit. Patient reports no anxiety, constipation, depressed mood, diarrhea, fatigue, hoarse voice, palpitations, tremors or weight loss. The symptoms have been stable (having thyroid US yearly - due now).  COPD - chronic stable sx.  Only using albuterol due to cost and only uses it intermittently. She continues to smoke cigarettes. Thoracic aortic aneurysm - recommending annual follow up due to size. She denies chest pain or pressure, unexplained back pain.  She is not taking BP lowering medication or a statin. Tobacco use - she continues to smoke 1 ppd.  She declines all vaccinations. Back pain - seen in the past [facility].  Not on any pain medications, takes baclofen at night if needed - can not tolerated during the day due to dizziness. NCS showed no neuropathy.  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  MRI last year was stable without change.  Review of Systems  Constitutional: Negative for  chills, fatigue, fever and weight loss.  HENT: Positive for postnasal drip and sore throat. Negative for congestion, hearing loss, hoarse voice, tinnitus, trouble swallowing and voice change.   Eyes: Negative for visual disturbance.  Respiratory: Positive for cough (intermittent, loose but non productive) and shortness of breath (with extreme exertion). Negative for chest tightness and wheezing.   Cardiovascular: Negative for chest pain, palpitations and leg swelling.  Gastrointestinal: Negative for abdominal pain, constipation, diarrhea and vomiting.  Endocrine: Negative for polydipsia and polyuria.  Genitourinary: Negative for dysuria, frequency, genital sores, vaginal bleeding and vaginal discharge.  Musculoskeletal: Positive for arthralgias, back pain and myalgias. Negative for gait problem and joint swelling.  Skin: Negative for color change and rash.  Neurological: Negative for dizziness, tremors, light-headedness and headaches.  Hematological: Negative for adenopathy. Does not bruise/bleed easily.  Psychiatric/Behavioral: Negative for dysphoric mood and sleep disturbance. The patient is not nervous/anxious.     Patient Active Problem List   Diagnosis Date Noted   Centrilobular emphysema ([place]) 07/08/2018   Lumbar back pain with radiculopathy affecting right lower extremity 10/12/2017   Neuropathy 07/06/2017   Multiple thyroid nodules 05/21/2016   Plantar fasciitis 03/11/2016   Ascending aortic aneurysm ([place]) 05/20/2015   Coronary artery calcification seen on CT scan 05/20/2015   Disease of spinal cord ([place]) 03/06/2015   Degeneration of intervertebral disc of lumbar region 02/17/2015   H/O abnormal cervical Papanicolaou smear 02/17/2015   Compulsive tobacco user syndrome 02/17/2015   Vitamin D deficiency 02/17/2015   Thymoma 07/17/2014   Leg weakness 04/14/2013   Cardiac murmur  08/31/2012   Essential hypertension 08/31/2012   CA of skin 08/31/2012     Allergies  Allergen Reactions   Antihistamines, Chlorpheniramine-Type Other (See Comments)    Makes her feel high   Oxymetazoline Other (See Comments)    Makes her "high"   Prednisone     lethargy and anger   Statins Other (See Comments)    Muscle Cramps    Lipoic Acid Other (See Comments)    Numbness in hands    Past Surgical History:  Procedure Laterality Date   CERVICAL CONE BIOPSY     KNEE ARTHROSCOPY Bilateral    THYMECTOMY  07/2014   TONSILLECTOMY     TUBAL LIGATION      Social History   Tobacco Use   Smoking status: Current Every Day Smoker    Packs/day: 1.00    Years: 48.00    Pack years: 48.00    Types: Cigarettes   Smokeless tobacco: Never Used  Substance Use Topics   Alcohol use: No    Alcohol/week: 0.0 standard drinks   Drug use: No     Medication list has been reviewed and updated.  Current Meds  Medication Sig   baclofen (LIORESAL) 10 MG tablet Take 1 tablet (10 mg total) by mouth 3 (three) times daily. (Patient taking differently: Take 5 mg by mouth 3 (three) times daily. )   Cholecalciferol (VITAMIN D) 2000 UNITS CAPS Take 1 capsule by mouth daily.   diclofenac (VOLTAREN) 50 MG EC tablet Take 1 tablet (50 mg total) by mouth 3 (three) times daily.   Multiple Vitamins-Minerals (WOMENS 50+ MULTI VITAMIN/MIN) TABS Take by mouth.   OMEGA-3 FATTY ACIDS PO Take by mouth.   PROAIR HFA 108 (90 Base) MCG/ACT inhaler INHALE 2 PUFFS INTO LUNGS EVERY 6 HOURS AS NEEDED FOR WHEEZING OR SHORTNESS OF BREATH   [DISCONTINUED] diclofenac (VOLTAREN) 50 MG EC tablet Take 1 tablet (50 mg total) by mouth 2 (two) times daily. (Patient taking differently: Take 50 mg by mouth 3 (three) times daily. )    PHQ 2/9 Scores 07/08/2018 05/15/2017 10/03/2015 09/18/2015  PHQ - 2 Score 0 0 0 0    BP Readings from Last 3 Encounters:  07/08/18 136/82  10/12/17 (!) 154/82  07/31/17 (!) 159/100    Physical Exam Vitals signs and nursing note reviewed.   Constitutional:      General: She is not in acute distress.    Appearance: She is well-developed.  HENT:     Head: Normocephalic and atraumatic.     Right Ear: Tympanic membrane and ear canal normal.     Left Ear: Tympanic membrane and ear canal normal.     Nose: Nose normal.     Right Sinus: No maxillary sinus tenderness.     Left Sinus: No maxillary sinus tenderness.     Mouth/Throat:     Mouth: Mucous membranes are moist.     Pharynx: Uvula midline. Posterior oropharyngeal erythema (mild pharyngeal swelling c/w PND) present.  Eyes:     General: No scleral icterus.       Right eye: No discharge.        Left eye: No discharge.     Conjunctiva/sclera: Conjunctivae normal.  Neck:     Musculoskeletal: Normal range of motion. No erythema.     Thyroid: No thyromegaly.     Vascular: No carotid bruit.  Cardiovascular:     Rate and Rhythm: Normal rate and regular rhythm.     Pulses:  Radial pulses are 2+ on the right side and 2+ on the left side.       Dorsalis pedis pulses are 1+ on the right side and 1+ on the left side.       Posterior tibial pulses are 1+ on the right side and 1+ on the left side.     Heart sounds: Normal heart sounds.  Pulmonary:     Effort: Pulmonary effort is normal. No respiratory distress.     Breath sounds: No wheezing.  Chest:     Breasts:        Right: No mass, nipple discharge, skin change or tenderness.        Left: No mass, nipple discharge, skin change or tenderness.  Abdominal:     General: Bowel sounds are normal.     Palpations: Abdomen is soft.     Tenderness: There is no abdominal tenderness.  Musculoskeletal: Normal range of motion.     Right lower leg: No edema.     Left lower leg: No edema.  Lymphadenopathy:     Cervical: No cervical adenopathy.  Skin:    General: Skin is warm and dry.     Findings: No rash.  Neurological:     Mental Status: She is alert and oriented to person, place, and time.     Cranial Nerves: No  cranial nerve deficit.     Sensory: No sensory deficit.     Deep Tendon Reflexes: Reflexes are normal and symmetric.  Psychiatric:        Speech: Speech normal.        Behavior: Behavior normal.        Thought Content: Thought content normal.     Wt Readings from Last 3 Encounters:  07/08/18 167 lb (75.8 kg)  10/12/17 165 lb 12.8 oz (75.2 kg)  07/31/17 166 lb 3.6 oz (75.4 kg)    BP 136/82    Pulse 78    Ht 5\' 4"  (1.626 m)    Wt 167 lb (75.8 kg)    SpO2 98%    BMI 28.67 kg/m   Assessment and Plan: 1. Annual physical exam Normal exam Mild throat discomfort from PND - gargles as needed Continue healthy diet and exercise - Lipid panel - POCT urinalysis dipstick  2. Encounter for screening mammogram for breast cancer Pt declines screening  3. Colon cancer screening - Fecal occult blood, imunochemical  4. Essential hypertension Improved after rest for 10 minutes - pt prefers not to take medication so will continue to monitor - CBC with Differential/Platelet - Comprehensive metabolic panel  5. Centrilobular emphysema ([place]) Encouraged efforts at quitting She does not feel she can attempt at this time  6. Multiple thyroid nodules - TSH - US THYROID; Future  7. Disease of spinal cord (HCC) Chronic back pain, seen by [facility] specialist; spinal cord lesion stable and NCS/EMG normal Takes no prescription pain medication Did not tolerate baclofen during the day due to dizziness so only take 1/2 tab at night if needed  8. Need for vaccination for pneumococcus Pt declines  9. Thoracic aortic aneurysm without rupture (HCC) Begin aspirin 81 mg per day - CT ANGIO CHEST AORTA W/CM &/OR WO/CM; Future  10. Vitamin D deficiency May need high dose Rx if not improved - VITAMIN D 25 Hydroxy (Vit-D Deficiency, Fractures)   Partially dictated using Editor, commissioning. Any errors are unintentional.  Halina Maidens, MD [facility]  07/08/2018

## 2018-07-09 LAB — CBC WITH DIFFERENTIAL/PLATELET
Basophils Absolute: 0 10*3/uL (ref 0.0–0.2)
Basos: 0 %
EOS (ABSOLUTE): 0.3 10*3/uL (ref 0.0–0.4)
Eos: 3 %
Hematocrit: 40.2 % (ref 34.0–46.6)
Hemoglobin: 13.8 g/dL (ref 11.1–15.9)
Immature Grans (Abs): 0 10*3/uL (ref 0.0–0.1)
Immature Granulocytes: 0 %
Lymphocytes Absolute: 2 10*3/uL (ref 0.7–3.1)
Lymphs: 22 %
MCH: 30.6 pg (ref 26.6–33.0)
MCHC: 34.3 g/dL (ref 31.5–35.7)
MCV: 89 fL (ref 79–97)
Monocytes Absolute: 0.7 10*3/uL (ref 0.1–0.9)
Monocytes: 7 %
Neutrophils Absolute: 6.2 10*3/uL (ref 1.4–7.0)
Neutrophils: 68 %
Platelets: 311 10*3/uL (ref 150–450)
RBC: 4.51 x10E6/uL (ref 3.77–5.28)
RDW: 12.7 % (ref 11.7–15.4)
WBC: 9.2 10*3/uL (ref 3.4–10.8)

## 2018-07-09 LAB — LIPID PANEL
Chol/HDL Ratio: 4.1 ratio (ref 0.0–4.4)
Cholesterol, Total: 233 mg/dL — ABNORMAL HIGH (ref 100–199)
HDL: 57 mg/dL (ref >39)
LDL Calculated: 140 mg/dL — ABNORMAL HIGH (ref 0–99)
Triglycerides: 182 mg/dL — ABNORMAL HIGH (ref 0–149)
VLDL Cholesterol Cal: 36 mg/dL (ref 5–40)

## 2018-07-09 LAB — COMPREHENSIVE METABOLIC PANEL
ALT: 15 IU/L (ref 0–32)
AST: 15 IU/L (ref 0–40)
Albumin/Globulin Ratio: 1.7 (ref 1.2–2.2)
Albumin: 4.7 g/dL (ref 3.8–4.8)
Alkaline Phosphatase: 64 IU/L (ref 39–117)
BUN/Creatinine Ratio: 10 — ABNORMAL LOW (ref 12–28)
BUN: 8 mg/dL (ref 8–27)
Bilirubin Total: 0.3 mg/dL (ref 0.0–1.2)
CO2: 23 mmol/L (ref 20–29)
Calcium: 10 mg/dL (ref 8.7–10.3)
Chloride: 99 mmol/L (ref 96–106)
Creatinine, Ser: 0.81 mg/dL (ref 0.57–1.00)
GFR calc Af Amer: 88 mL/min/{1.73_m2} (ref >59)
GFR calc non Af Amer: 76 mL/min/{1.73_m2} (ref >59)
Globulin, Total: 2.7 g/dL (ref 1.5–4.5)
Glucose: 106 mg/dL — ABNORMAL HIGH (ref 65–99)
Potassium: 4.8 mmol/L (ref 3.5–5.2)
Sodium: 139 mmol/L (ref 134–144)
Total Protein: 7.4 g/dL (ref 6.0–8.5)

## 2018-07-09 LAB — VITAMIN D 25 HYDROXY (VIT D DEFICIENCY, FRACTURES): Vit D, 25-Hydroxy: 34.1 ng/mL (ref 30.0–100.0)

## 2018-07-09 LAB — TSH: TSH: 1.12 u[IU]/mL (ref 0.450–4.500)

## 2018-07-13 ENCOUNTER — Other Ambulatory Visit: Payer: Self-pay | Admitting: Internal Medicine

## 2018-07-26 ENCOUNTER — Ambulatory Visit: Payer: Medicare HMO

## 2018-07-30 ENCOUNTER — Ambulatory Visit: Payer: Medicare HMO | Admitting: Oncology

## 2018-08-04 ENCOUNTER — Ambulatory Visit
Admission: RE | Admit: 2018-08-04 | Discharge: 2018-08-04 | Disposition: A | Discharge status: Home / Self Care | Payer: Medicare HMO | Source: Ambulatory Visit | Attending: Internal Medicine | Admitting: Internal Medicine

## 2018-08-04 ENCOUNTER — Other Ambulatory Visit: Payer: Self-pay

## 2018-08-04 DIAGNOSIS — E042 Nontoxic multinodular goiter: Secondary | ICD-10-CM | POA: Insufficient documentation

## 2018-08-06 ENCOUNTER — Ambulatory Visit: Payer: Medicare HMO | Admitting: Oncology

## 2018-08-17 ENCOUNTER — Other Ambulatory Visit: Payer: Self-pay | Admitting: Internal Medicine

## 2018-08-17 ENCOUNTER — Ambulatory Visit
Admission: RE | Admit: 2018-08-17 | Discharge: 2018-08-17 | Disposition: A | Discharge status: Home / Self Care | Payer: Medicare HMO | Source: Ambulatory Visit | Attending: Internal Medicine | Admitting: Internal Medicine

## 2018-08-17 ENCOUNTER — Other Ambulatory Visit: Payer: Self-pay

## 2018-08-17 ENCOUNTER — Ambulatory Visit: Payer: Medicare HMO

## 2018-08-17 DIAGNOSIS — I712 Thoracic aortic aneurysm, without rupture, unspecified: Secondary | ICD-10-CM

## 2018-08-17 DIAGNOSIS — I2510 Atherosclerotic heart disease of native coronary artery without angina pectoris: Secondary | ICD-10-CM

## 2018-08-17 MED ORDER — IOPAMIDOL (ISOVUE-370) INJECTION 76%
100.0000 mL | Freq: Once | INTRAVENOUS | Status: AC | PRN
  Administered 2018-08-17: 10:00:00 100 mL via INTRAVENOUS

## 2018-08-17 NOTE — Progress Notes (Signed)
Patient informed. Wants to be referred to Dr. Fletcher Anon again. Was seen in 2017 by your referral then.  Please advise.

## 2018-08-18 ENCOUNTER — Telehealth: Payer: Self-pay | Admitting: Cardiovascular Disease

## 2018-08-18 NOTE — Telephone Encounter (Signed)
Virtual Visit Pre-Appointment Phone Call  "(Name), I am calling you today to discuss your upcoming appointment. We are currently trying to limit exposure to the virus that causes COVID-19 by seeing patients at home rather than in the office."  1. "What is the BEST phone number to call the day of the visit?" - include this in appointment notes  2. Do you have or have access to (through a family member/friend) a smartphone with video capability that we can use for your visit?" a. If yes - list this number in appt notes as cell (if different from BEST phone #) and list the appointment type as a VIDEO visit in appointment notes b. If no - list the appointment type as a PHONE visit in appointment notes  3. Confirm consent - "In the setting of the current Covid19 crisis, you are scheduled for a (phone or video) visit with your provider on (date) at (time).  Just as we do with many in-office visits, in order for you to participate in this visit, we must obtain consent.  If you'd like, I can send this to your mychart (if signed up) or email for you to review.  Otherwise, I can obtain your verbal consent now.  All virtual visits are billed to your insurance company just like a normal visit would be.  By agreeing to a virtual visit, we'd like you to understand that the technology does not allow for your provider to perform an examination, and thus may limit your provider's ability to fully assess your condition. If your provider identifies any concerns that need to be evaluated in person, we will make arrangements to do so.  Finally, though the technology is pretty good, we cannot assure that it will always work on either your or our end, and in the setting of a video visit, we may have to convert it to a phone-only visit.  In either situation, we cannot ensure that we have a secure connection.  Are you willing to proceed?" STAFF: Did the patient verbally acknowledge consent to telehealth visit? Document  YES/NO here: YES  4. Advise patient to be prepared - "Two hours prior to your appointment, go ahead and check your blood pressure, pulse, oxygen saturation, and your weight (if you have the equipment to check those) and write them all down. When your visit starts, your provider will ask you for this information. If you have an Apple Watch or Kardia device, please plan to have heart rate information ready on the day of your appointment. Please have a pen and paper handy nearby the day of the visit as well."  5. Give patient instructions for MyChart download to smartphone OR Doximity/Doxy.me as below if video visit (depending on what platform provider is using)  6. Inform patient they will receive a phone call 15 minutes prior to their appointment time (may be from unknown caller ID) so they should be prepared to answer    TELEPHONE CALL NOTE  Chelsea Santana has been deemed a candidate for a follow-up tele-health visit to limit community exposure during the Covid-19 pandemic. I spoke with the patient via phone to ensure availability of phone/video source, confirm preferred email & phone number, and discuss instructions and expectations.  I reminded Chelsea Santana to be prepared with any vital sign and/or heart rhythm information that could potentially be obtained via home monitoring, at the time of her visit. I reminded Chelsea Santana to expect a phone call prior to her visit.  Clarisse Gouge 08/18/2018 4:25 PM   INSTRUCTIONS FOR DOWNLOADING THE MYCHART APP TO SMARTPHONE  - The patient must first make sure to have activated MyChart and know their login information - If Apple, go to [employer] and type in MyChart in the search bar and download the app. If Android, ask patient to go to [employer] and type in [place] in the search bar and download the app. The app is free but as with any other app downloads, their phone may require them to verify saved payment information or Apple/Android  password.  - The patient will need to then log into the app with their MyChart username and password, and select [facility] as their healthcare provider to link the account. When it is time for your visit, go to the MyChart app, find appointments, and click Begin Video Visit. Be sure to Select Allow for your device to access the Microphone and Camera for your visit. You will then be connected, and your provider will be with you shortly.  **If they have any issues connecting, or need assistance please contact MyChart service desk [phone])**  **If using a computer, in order to ensure the best quality for their visit they will need to use either of the following Internet Browsers: [employer], or Google Chrome**  IF USING DOXIMITY or DOXY.ME - The patient will receive a link just prior to their visit by text.     FULL LENGTH CONSENT FOR TELE-HEALTH VISIT   I hereby voluntarily request, consent and authorize [facility] and its employed or contracted physicians, physician assistants, nurse practitioners or other licensed health care professionals (the Practitioner), to provide me with telemedicine health care services (the Services") as deemed necessary by the treating Practitioner. I acknowledge and consent to receive the Services by the Practitioner via telemedicine. I understand that the telemedicine visit will involve communicating with the Practitioner through live audiovisual communication technology and the disclosure of certain medical information by electronic transmission. I acknowledge that I have been given the opportunity to request an in-person assessment or other available alternative prior to the telemedicine visit and am voluntarily participating in the telemedicine visit.  I understand that I have the right to withhold or withdraw my consent to the use of telemedicine in the course of my care at any time, without affecting my right to future care or treatment,  and that the Practitioner or I may terminate the telemedicine visit at any time. I understand that I have the right to inspect all information obtained and/or recorded in the course of the telemedicine visit and may receive copies of available information for a reasonable fee.  I understand that some of the potential risks of receiving the Services via telemedicine include:   Delay or interruption in medical evaluation due to technological equipment failure or disruption;  Information transmitted may not be sufficient (e.g. poor resolution of images) to allow for appropriate medical decision making by the Practitioner; and/or   In rare instances, security protocols could fail, causing a breach of personal health information.  Furthermore, I acknowledge that it is my responsibility to provide information about my medical history, conditions and care that is complete and accurate to the best of my ability. I acknowledge that Practitioner's advice, recommendations, and/or decision may be based on factors not within their control, such as incomplete or inaccurate data provided by me or distortions of diagnostic images or specimens that may result from electronic transmissions. I understand that the  practice of medicine is not an Chief Strategy Officer and that Practitioner makes no warranties or guarantees regarding treatment outcomes. I acknowledge that I will receive a copy of this consent concurrently upon execution via email to the email address I last provided but may also request a printed copy by calling the office of [facility].    I understand that my insurance will be billed for this visit.   I have read or had this consent read to me.  I understand the contents of this consent, which adequately explains the benefits and risks of the Services being provided via telemedicine.   I have been provided ample opportunity to ask questions regarding this consent and the Services and have had my questions  answered to my satisfaction.  I give my informed consent for the services to be provided through the use of telemedicine in my medical care  By participating in this telemedicine visit I agree to the above.

## 2018-08-19 ENCOUNTER — Telehealth (INDEPENDENT_AMBULATORY_CARE_PROVIDER_SITE_OTHER): Payer: Medicare HMO | Admitting: Cardiovascular Disease

## 2018-08-19 ENCOUNTER — Other Ambulatory Visit: Payer: Self-pay

## 2018-08-19 ENCOUNTER — Telehealth: Payer: Self-pay | Admitting: Cardiovascular Disease

## 2018-08-19 VITALS — Ht 64.0 in | Wt 167.0 lb

## 2018-08-19 DIAGNOSIS — R69 Illness, unspecified: Secondary | ICD-10-CM | POA: Diagnosis not present

## 2018-08-19 DIAGNOSIS — I10 Essential (primary) hypertension: Secondary | ICD-10-CM | POA: Diagnosis not present

## 2018-08-19 DIAGNOSIS — F17200 Nicotine dependence, unspecified, uncomplicated: Secondary | ICD-10-CM

## 2018-08-19 DIAGNOSIS — I712 Thoracic aortic aneurysm, without rupture, unspecified: Secondary | ICD-10-CM

## 2018-08-19 DIAGNOSIS — I739 Peripheral vascular disease, unspecified: Secondary | ICD-10-CM

## 2018-08-19 DIAGNOSIS — E782 Mixed hyperlipidemia: Secondary | ICD-10-CM

## 2018-08-19 DIAGNOSIS — I2510 Atherosclerotic heart disease of native coronary artery without angina pectoris: Secondary | ICD-10-CM | POA: Diagnosis not present

## 2018-08-19 MED ORDER — ROSUVASTATIN CALCIUM 5 MG PO TABS: 5.0000 mg | ORAL_TABLET | Freq: Every day | ORAL | 3 refills | Status: DC

## 2018-08-19 NOTE — Telephone Encounter (Signed)
Spoke with patient and reviewed appointment information, medications, allergies, and she does not have blood pressure cuff at home. Confirmed her appointment time and consent and she verbalized understanding with no further questions at this time.   YOUR CARDIOLOGY TEAM HAS ARRANGED FOR AN E-VISIT FOR YOUR APPOINTMENT - PLEASE REVIEW IMPORTANT INFORMATION BELOW SEVERAL DAYS PRIOR TO YOUR APPOINTMENT  Due to the recent COVID-19 pandemic, we are transitioning in-person office visits to tele-medicine visits in an effort to decrease unnecessary exposure to our patients, their families, and staff. These visits are billed to your insurance just like a normal visit is. We also encourage you to sign up for MyChart if you have not already done so. You will need a smartphone if possible. For patients that do not have this, we can still complete the visit using a regular telephone but do prefer a smartphone to enable video when possible. You may have a family member that lives with you that can help. If possible, we also ask that you have a blood pressure cuff and scale at home to measure your blood pressure, heart rate and weight prior to your scheduled appointment. Patients with clinical needs that need an in-person evaluation and testing will still be able to come to the office if absolutely necessary. If you have any questions, feel free to call our office.   CONSENT FOR TELE-HEALTH VISIT - PLEASE REVIEW  I hereby voluntarily request, consent and authorize [facility] and its employed or contracted physicians, physician assistants, nurse practitioners or other licensed health care professionals (the Practitioner), to provide me with telemedicine health care services (the "Services") as deemed necessary by the treating Practitioner. I acknowledge and consent to receive the Services by the Practitioner via telemedicine. I understand that the telemedicine visit will involve communicating with the Practitioner  through live audiovisual communication technology and the disclosure of certain medical information by electronic transmission. I acknowledge that I have been given the opportunity to request an in-person assessment or other available alternative prior to the telemedicine visit and am voluntarily participating in the telemedicine visit.  I understand that I have the right to withhold or withdraw my consent to the use of telemedicine in the course of my care at any time, without affecting my right to future care or treatment, and that the Practitioner or I may terminate the telemedicine visit at any time. I understand that I have the right to inspect all information obtained and/or recorded in the course of the telemedicine visit and may receive copies of available information for a reasonable fee.  I understand that some of the potential risks of receiving the Services via telemedicine include:  Marland Kitchen Delay or interruption in medical evaluation due to technological equipment failure or disruption; . Information transmitted may not be sufficient (e.g. poor resolution of images) to allow for appropriate medical decision making by the Practitioner; and/or  . In rare instances, security protocols could fail, causing a breach of personal health information.  Furthermore, I acknowledge that it is my responsibility to provide information about my medical history, conditions and care that is complete and accurate to the best of my ability. I acknowledge that Practitioner's advice, recommendations, and/or decision may be based on factors not within their control, such as incomplete or inaccurate data provided by me or distortions of diagnostic images or specimens that may result from electronic transmissions. I understand that the practice of medicine is not an exact science and that Practitioner makes no warranties or guarantees regarding  treatment outcomes. I acknowledge that I will receive a copy of this consent  concurrently upon execution via email to the email address I last provided but may also request a printed copy by calling the office of [facility].    I understand that my insurance will be billed for this visit.   I have read or had this consent read to me. . I understand the contents of this consent, which adequately explains the benefits and risks of the Services being provided via telemedicine.  . I have been provided ample opportunity to ask questions regarding this consent and the Services and have had my questions answered to my satisfaction. . I give my informed consent for the services to be provided through the use of telemedicine in my medical care  By participating in this telemedicine visit I agree to the above.

## 2018-08-19 NOTE — Patient Instructions (Addendum)
Medication Instructions:  Your physician has recommended you make the following change in your medication:  1. START Rosuvastatin (Crestor) 5 mg once daily at bedtime.   If you need a refill on your cardiac medications before your next appointment, please call your pharmacy.    Lab work: We would like to get a liver panel and lipid profile in 3 months. For these labs we would like you to not eat or drink anything after midnight before except small sip of water with pills. Go to the [facility] Entrance of the hospital Mid-August to have these labs done. You will check in at the front desk and they will tell you where to go. No appointment is needed and we will call you with results.    If you have labs (blood work) drawn today and your tests are completely normal, you will receive your results only by: Marland Kitchen MyChart Message (if you have MyChart) OR . A paper copy in the mail If you have any lab test that is abnormal or we need to change your treatment, we will call you to review the results.   Testing/Procedures: No new testing needed   Follow-Up: At [facility], you and your health needs are our priority.  As part of our continuing mission to provide you with exceptional heart care, we have created designated Provider Care Teams.  These Care Teams include your primary Cardiologist (physician) and Advanced Practice Providers (APPs -  Physician Assistants and Nurse Practitioners) who all work together to provide you with the care you need, when you need it.  . You will need a follow up appointment in 12 months .   Please call our office 2 months in advance to schedule this appointment.    . Providers on your designated Care Team:   . Murray Hodgkins, NP . Christell Faith, PA-C . Marrianne Mood, PA-C  Any Other Special Instructions Will Be Listed Below (If Applicable).  For educational health videos Log in to : [email or web address] Or : [email or web address], password : [email or web address]

## 2018-08-19 NOTE — Progress Notes (Signed)
Virtual Visit via Video Note   This visit type was conducted due to national recommendations for restrictions regarding the COVID-19 Pandemic (e.g. social distancing) in an effort to limit this patient's exposure and mitigate transmission in our community.  Due to her co-morbid illnesses, this patient is at least at moderate risk for complications without adequate follow up.  This format is felt to be most appropriate for this patient at this time.  All issues noted in this document were discussed and addressed.  A limited physical exam was performed with this format.  Please refer to the patient's chart for her consent to telehealth for [facility].   I connected with  Chelsea Santana on 08/19/18 by a video enabled telemedicine application and verified that I am speaking with the correct person using two identifiers. I discussed the limitations of evaluation and management by telemedicine. The patient expressed understanding and agreed to proceed.   Evaluation Performed:  Follow-up visit  Date:  08/19/2018   ID:  Chelsea Santana, DOB [birth year], MRN [id number]  Patient Location:  [place] [address]   Provider location:   [facility], [place] office  PCP:  [name], MD  Cardiologist:  Arvid Right [facility]   Chief Complaint:  Shortness of breath, aortic atherosclerosis, dilated aorta    History of Present Illness:    Chelsea Santana is a 68 y.o. female who presents via audio/video conferencing for a telehealth visit today.   The patient does not symptoms concerning for COVID-19 infection (fever, chills, cough, or new SHORTNESS OF BREATH).   Patient has a past medical history of long history of smoking for more than 40 years,  coronary artery disease,  PAD,  hypertension   history of thymoma with resection Moderate aortic atherosclerosis Coronary calcification seen on CT, 3.9-4 cm dilated a sending aorta who presents for f/o of her CAD and  chest  pain   Last office visit 3 years ago At which time CT scan documented diffuse aortic atherosclerosis coronary calcification and dilated aorta 4 cm CT chest 08/2018 reviewed with her in detail, images pulled up and discussed No significant change in images, Still with significant diffuse aortic atherosclerosis, coronary calcification, 4 cm ascending aorta  She continues to smoke No regular exercise program, Currently not on any medications for cholesterol Cholesterol typically runs 230 up to 240 Reports previously having statin myalgia on possibly Lipitor though details unclear  Denies any symptoms concerning for angina Recent stressors, mother is sick and expected to pass Adopted a 9 year old child  Discussed her options for smoking cessation Does not want Chantix, has a nicotine inhaler   Prior CV studies:   The following studies were reviewed today:    Past Medical History:  Diagnosis Date  . Cancer (HCC)    Thymoma, Stage II  . Hypertension   . Neuropathy 07/06/2017   08/2017 NCS/EMG   SUMMARY:  1. NCS - The left sural sensory response was normal. The left   peroneal motor and tibial motor responses were normal with normal   F-waves.  2. EMG - Concentric needle examination of the left lower   extremity was performed. All muscles tested were normal.     CONCLUSION: This is a normal study. There is no   electrophysiologic evidence of a left lower extrem   Past Surgical History:  Procedure Laterality Date  . CERVICAL CONE BIOPSY    . KNEE ARTHROSCOPY Bilateral   . THYMECTOMY  07/2014  . TONSILLECTOMY    . TUBAL LIGATION       Current Meds  Medication Sig  . aspirin EC 81 MG tablet Take 81 mg by mouth daily.  . baclofen (LIORESAL) 10 MG tablet Take 1 tablet (10 mg total) by mouth 3 (three) times daily. (Patient taking differently: Take 5 mg by mouth 3 (three) times daily. )  . Cholecalciferol (VITAMIN D) 2000 UNITS CAPS Take 1 capsule by mouth daily.  .  diclofenac (VOLTAREN) 50 MG EC tablet TAKE 1 TABLET BY MOUTH TWICE A DAY  . Multiple Vitamins-Minerals (WOMENS 50+ MULTI VITAMIN/MIN) TABS Take by mouth.  . OMEGA-3 FATTY ACIDS PO Take by mouth.  Marland Kitchen PROAIR HFA 108 (90 Base) MCG/ACT inhaler INHALE 2 PUFFS INTO LUNGS EVERY 6 HOURS AS NEEDED FOR WHEEZING OR SHORTNESS OF BREATH     Allergies:   Antihistamines, chlorpheniramine-type; Oxymetazoline; Prednisone; Statins; and Lipoic acid   Social History   Tobacco Use  . Smoking status: Current Every Day Smoker    Packs/day: 1.00    Years: 48.00    Pack years: 48.00    Types: Cigarettes  . Smokeless tobacco: Never Used  Substance Use Topics  . Alcohol use: No    Alcohol/week: 0.0 standard drinks  . Drug use: No     Current Outpatient Medications on File Prior to Visit  Medication Sig Dispense Refill  . aspirin EC 81 MG tablet Take 81 mg by mouth daily.    . baclofen (LIORESAL) 10 MG tablet Take 1 tablet (10 mg total) by mouth 3 (three) times daily. (Patient taking differently: Take 5 mg by mouth 3 (three) times daily. ) 90 tablet 5  . Cholecalciferol (VITAMIN D) 2000 UNITS CAPS Take 1 capsule by mouth daily.    . diclofenac (VOLTAREN) 50 MG EC tablet TAKE 1 TABLET BY MOUTH TWICE A DAY 60 tablet 11  . Multiple Vitamins-Minerals (WOMENS 50+ MULTI VITAMIN/MIN) TABS Take by mouth.    . OMEGA-3 FATTY ACIDS PO Take by mouth.    Marland Kitchen PROAIR HFA 108 (90 Base) MCG/ACT inhaler INHALE 2 PUFFS INTO LUNGS EVERY 6 HOURS AS NEEDED FOR WHEEZING OR SHORTNESS OF BREATH 8.5 Inhaler 0   No current facility-administered medications on file prior to visit.      Family Hx: The patient's family history includes Diabetes in her brother and sister; Lung cancer in her father; Multiple myeloma in her mother.  ROS:   Please see the history of present illness.    Review of Systems  Constitutional: Negative.   HENT: Negative.   Respiratory: Positive for shortness of breath.   Cardiovascular: Negative.    Gastrointestinal: Negative.   Musculoskeletal: Negative.   Neurological: Negative.   Psychiatric/Behavioral: Negative.   All other systems reviewed and are negative.     Labs/Other Tests and Data Reviewed:    Recent Labs: 07/08/2018: ALT 15; BUN 8; Creatinine, Ser 0.81; Hemoglobin 13.8; Platelets 311; Potassium 4.8; Sodium 139; TSH 1.120   Recent Lipid Panel Lab Results  Component Value Date/Time   CHOL 233 (H) 07/08/2018 11:07 AM   TRIG 182 (H) 07/08/2018 11:07 AM   HDL 57 07/08/2018 11:07 AM   CHOLHDL 4.1 07/08/2018 11:07 AM   LDLCALC 140 (H) 07/08/2018 11:07 AM    Wt Readings from Last 3 Encounters:  08/19/18 167 lb (75.8 kg)  07/08/18 167 lb (75.8 kg)  10/12/17 165 lb 12.8 oz (75.2 kg)     Exam:    Vital Signs: Vital signs  may also be detailed in the HPI Ht '5\' 4"'$  (1.626 m)   Wt 167 lb (75.8 kg)   BMI 28.67 kg/m   Wt Readings from Last 3 Encounters:  08/19/18 167 lb (75.8 kg)  07/08/18 167 lb (75.8 kg)  10/12/17 165 lb 12.8 oz (75.2 kg)   Temp Readings from Last 3 Encounters:  07/31/17 (!) 97.5 F (36.4 C) (Tympanic)  06/02/17 98 F (36.7 C) (Oral)  08/01/16 97.4 F (36.3 C) (Tympanic)   BP Readings from Last 3 Encounters:  07/08/18 136/82  10/12/17 (!) 154/82  07/31/17 (!) 159/100   Pulse Readings from Last 3 Encounters:  07/08/18 78  10/12/17 96  07/31/17 90    130s over 80, pulse 70s, respirations 16  Well nourished, well developed female in no acute distress. Constitutional:  oriented to person, place, and time. No distress.  Head: Normocephalic and atraumatic.  Eyes:  no discharge. No scleral icterus.  Neck: Normal range of motion. Neck supple.  Pulmonary/Chest: No audible wheezing, no distress, appears comfortable Musculoskeletal: Normal range of motion.  no  tenderness or deformity.  Neurological:   Coordination normal. Full exam not performed Skin:  No rash Psychiatric:  normal mood and affect. behavior is normal. Thought content normal.     ASSESSMENT & PLAN:    Thoracic aortic aneurysm without rupture (HCC) Stable dilated aorta 3.9 up to 4 cm No further work-up needed, no change over the past 3 years Could consider echocardiogram in 2021 for further evaluation  PAD (peripheral artery disease) ([place]) Discussed diffuse aortic atherosclerosis Recommended smoking cessation Goal total cholesterol less than 150  Coronary artery calcification seen on CT scan - Denies any symptoms concerning for angina  discussed anginal symptoms, No further testing at this time Stress testing could be ordered in the future for any new symptoms  Essential hypertension Blood pressure is well controlled on today's visit. No changes made to the medications. She suspects that stress is periodically pushing her blood pressure higher  Smoker We have encouraged her to continue to work on weaning her cigarettes and smoking cessation. She will continue to work on this and does not want any assistance with chantix.   Mixed hyperlipidemia - Plan: Lipid panel, Hepatic function panel She is willing to try Crestor 5 mg every other day titrating up to every day If stable on Crestor 5 daily then would add Zetia to achieve goal LDL less than 70   COVID-19 Education: The signs and symptoms of COVID-19 were discussed with the patient and how to seek care for testing (follow up with PCP or arrange E-visit).  The importance of social distancing was discussed today.  Patient Risk:   After full review of this patients clinical status, I feel that they are at least moderate risk at this time.  Time:   Today, I have spent 25 minutes with the patient with telehealth technology discussing the cardiac and medical problems/diagnoses detailed above   10 min spent reviewing the chart prior to patient visit today   Medication Adjustments/Labs and Tests Ordered: Current medicines are reviewed at length with the patient today.  Concerns regarding medicines are  outlined above.   Tests Ordered: Recommend repeat liver and lipid in 3 months time   Medication Changes: No changes made   Disposition: Follow-up in 12 months   Signed, Ida Rogue, MD  08/19/2018 4:23 PM    [place] Office [address] [place] [address], [place], [place] [address]

## 2018-09-01 ENCOUNTER — Other Ambulatory Visit: Payer: Self-pay | Admitting: Internal Medicine

## 2018-09-01 ENCOUNTER — Other Ambulatory Visit: Payer: Self-pay

## 2018-09-01 ENCOUNTER — Ambulatory Visit (INDEPENDENT_AMBULATORY_CARE_PROVIDER_SITE_OTHER): Payer: Medicare HMO

## 2018-09-01 VITALS — BP 136/82 | HR 106 | Temp 98.0°F | Resp 16 | Ht 64.0 in | Wt 165.0 lb

## 2018-09-01 DIAGNOSIS — Z0000 Encounter for general adult medical examination without abnormal findings: Secondary | ICD-10-CM

## 2018-09-01 DIAGNOSIS — J449 Chronic obstructive pulmonary disease, unspecified: Secondary | ICD-10-CM

## 2018-09-01 DIAGNOSIS — G959 Disease of spinal cord, unspecified: Secondary | ICD-10-CM

## 2018-09-01 MED ORDER — ALBUTEROL SULFATE HFA 108 (90 BASE) MCG/ACT IN AERS: 2.0000 | INHALATION_SPRAY | RESPIRATORY_TRACT | 5 refills | Status: DC | PRN
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
## 2018-09-01 NOTE — Patient Instructions (Addendum)
Chelsea Santana , Thank you for taking time to come for your Medicare Wellness Visit. I appreciate your ongoing commitment to your health goals. Please review the following plan we discussed and let me know if I can assist you in the future.   Screening recommendations/referrals: Colonoscopy: postponed Mammogram: postponed Bone Density: postponed Recommended yearly ophthalmology/optometry visit for glaucoma screening and checkup. Please call [facility] at [phone] to schedule an appointment..  Recommended yearly dental visit for hygiene and checkup  Vaccinations: Influenza vaccine: postponed Pneumococcal vaccine: postponed Tdap vaccine: done 05/21/15 Shingles vaccine: postponed    Advanced directives: Advance directive discussed with you today. I have provided a copy for you to complete at home and have notarized. Once this is complete please bring a copy in to our office so we can scan it into your chart.  Conditions/risks identified: Recommend healthy eating and physical activity to lower cholesterol.  Next appointment: Please follow up in one year for your Medicare Annual Wellness visit.     Preventive Care 4 Years and Older, Female Preventive care refers to lifestyle choices and visits with your health care provider that can promote health and wellness. What does preventive care include?  A yearly physical exam. This is also called an annual well check.  Dental exams once or twice a year.  Routine eye exams. Ask your health care provider how often you should have your eyes checked.  Personal lifestyle choices, including:  Daily care of your teeth and gums.  Regular physical activity.  Eating a healthy diet.  Avoiding tobacco and drug use.  Limiting alcohol use.  Practicing safe sex.  Taking low-dose aspirin every day.  Taking vitamin and mineral supplements as recommended by your health care provider. What happens during an annual well check? The  services and screenings done by your health care provider during your annual well check will depend on your age, overall health, lifestyle risk factors, and family history of disease. Counseling  Your health care provider may ask you questions about your:  Alcohol use.  Tobacco use.  Drug use.  Emotional well-being.  Home and relationship well-being.  Sexual activity.  Eating habits.  History of falls.  Memory and ability to understand (cognition).  Work and work Statistician.  Reproductive health. Screening  You may have the following tests or measurements:  Height, weight, and BMI.  Blood pressure.  Lipid and cholesterol levels. These may be checked every 5 years, or more frequently if you are over 82 years old.  Skin check.  Lung cancer screening. You may have this screening every year starting at age 33 if you have a 30-pack-year history of smoking and currently smoke or have quit within the past 15 years.  Fecal occult blood test (FOBT) of the stool. You may have this test every year starting at age 75.  Flexible sigmoidoscopy or colonoscopy. You may have a sigmoidoscopy every 5 years or a colonoscopy every 10 years starting at age 72.  Hepatitis C blood test.  Hepatitis B blood test.  Sexually transmitted disease (STD) testing.  Diabetes screening. This is done by checking your blood sugar (glucose) after you have not eaten for a while (fasting). You may have this done every 1-3 years.  Bone density scan. This is done to screen for osteoporosis. You may have this done starting at age 33.  Mammogram. This may be done every 1-2 years. Talk to your health care provider about how often you should have regular mammograms. Talk with  your health care provider about your test results, treatment options, and if necessary, the need for more tests. Vaccines  Your health care provider may recommend certain vaccines, such as:  Influenza vaccine. This is recommended  every year.  Tetanus, diphtheria, and acellular pertussis (Tdap, Td) vaccine. You may need a Td booster every 10 years.  Zoster vaccine. You may need this after age 55.  Pneumococcal 13-valent conjugate (PCV13) vaccine. One dose is recommended after age 72.  Pneumococcal polysaccharide (PPSV23) vaccine. One dose is recommended after age 4. Talk to your health care provider about which screenings and vaccines you need and how often you need them. This information is not intended to replace advice given to you by your health care provider. Make sure you discuss any questions you have with your health care provider. Document Released: 04/20/2015 Document Revised: 12/12/2015 Document Reviewed: 01/23/2015 Elsevier Interactive Patient Education  2017 [place] Prevention in the Home Falls can cause injuries. They can happen to people of all ages. There are many things you can do to make your home safe and to help prevent falls. What can I do on the outside of my home?  Regularly fix the edges of walkways and driveways and fix any cracks.  Remove anything that might make you trip as you walk through a door, such as a raised step or threshold.  Trim any bushes or trees on the path to your home.  Use bright outdoor lighting.  Clear any walking paths of anything that might make someone trip, such as rocks or tools.  Regularly check to see if handrails are loose or broken. Make sure that both sides of any steps have handrails.  Any raised decks and porches should have guardrails on the edges.  Have any leaves, snow, or ice cleared regularly.  Use sand or salt on walking paths during winter.  Clean up any spills in your garage right away. This includes oil or grease spills. What can I do in the bathroom?  Use night lights.  Install grab bars by the toilet and in the tub and shower. Do not use towel bars as grab bars.  Use non-skid mats or decals in the tub or shower.  If  you need to sit down in the shower, use a plastic, non-slip stool.  Keep the floor dry. Clean up any water that spills on the floor as soon as it happens.  Remove soap buildup in the tub or shower regularly.  Attach bath mats securely with double-sided non-slip rug tape.  Do not have throw rugs and other things on the floor that can make you trip. What can I do in the bedroom?  Use night lights.  Make sure that you have a light by your bed that is easy to reach.  Do not use any sheets or blankets that are too big for your bed. They should not hang down onto the floor.  Have a firm chair that has side arms. You can use this for support while you get dressed.  Do not have throw rugs and other things on the floor that can make you trip. What can I do in the kitchen?  Clean up any spills right away.  Avoid walking on wet floors.  Keep items that you use a lot in easy-to-reach places.  If you need to reach something above you, use a strong step stool that has a grab bar.  Keep electrical cords out of the way.  Do not  use floor polish or wax that makes floors slippery. If you must use wax, use non-skid floor wax.  Do not have throw rugs and other things on the floor that can make you trip. What can I do with my stairs?  Do not leave any items on the stairs.  Make sure that there are handrails on both sides of the stairs and use them. Fix handrails that are broken or loose. Make sure that handrails are as long as the stairways.  Check any carpeting to make sure that it is firmly attached to the stairs. Fix any carpet that is loose or worn.  Avoid having throw rugs at the top or bottom of the stairs. If you do have throw rugs, attach them to the floor with carpet tape.  Make sure that you have a light switch at the top of the stairs and the bottom of the stairs. If you do not have them, ask someone to add them for you. What else can I do to help prevent falls?  Wear shoes  that:  Do not have high heels.  Have rubber bottoms.  Are comfortable and fit you well.  Are closed at the toe. Do not wear sandals.  If you use a stepladder:  Make sure that it is fully opened. Do not climb a closed stepladder.  Make sure that both sides of the stepladder are locked into place.  Ask someone to hold it for you, if possible.  Clearly mark and make sure that you can see:  Any grab bars or handrails.  First and last steps.  Where the edge of each step is.  Use tools that help you move around (mobility aids) if they are needed. These include:  Canes.  Walkers.  Scooters.  Crutches.  Turn on the lights when you go into a dark area. Replace any light bulbs as soon as they burn out.  Set up your furniture so you have a clear path. Avoid moving your furniture around.  If any of your floors are uneven, fix them.  If there are any pets around you, be aware of where they are.  Review your medicines with your doctor. Some medicines can make you feel dizzy. This can increase your chance of falling. Ask your doctor what other things that you can do to help prevent falls. This information is not intended to replace advice given to you by your health care provider. Make sure you discuss any questions you have with your health care provider. Document Released: 01/18/2009 Document Revised: 08/30/2015 Document Reviewed: 04/28/2014 Elsevier Interactive Patient Education  2017 [employer].

## 2018-09-27 DIAGNOSIS — G959 Disease of spinal cord, unspecified: Secondary | ICD-10-CM | POA: Diagnosis not present

## 2018-09-27 DIAGNOSIS — M5104 Intervertebral disc disorders with myelopathy, thoracic region: Secondary | ICD-10-CM | POA: Diagnosis not present

## 2018-09-28 DIAGNOSIS — G959 Disease of spinal cord, unspecified: Secondary | ICD-10-CM | POA: Diagnosis not present

## 2018-09-30 ENCOUNTER — Other Ambulatory Visit: Payer: Self-pay

## 2018-09-30 DIAGNOSIS — J449 Chronic obstructive pulmonary disease, unspecified: Secondary | ICD-10-CM

## 2018-09-30 MED ORDER — ALBUTEROL SULFATE HFA 108 (90 BASE) MCG/ACT IN AERS: 2.0000 | INHALATION_SPRAY | RESPIRATORY_TRACT | 5 refills | Status: DC | PRN

## 2018-09-30 MED ORDER — BACLOFEN 10 MG PO TABS: 10.0000 mg | ORAL_TABLET | Freq: Two times a day (BID) | ORAL | 1 refills | Status: DC

## 2019-01-12 ENCOUNTER — Other Ambulatory Visit: Payer: Self-pay | Admitting: Internal Medicine

## 2019-03-15 ENCOUNTER — Emergency Department: Payer: Medicare HMO

## 2019-03-15 ENCOUNTER — Observation Stay: Payer: Medicare HMO

## 2019-03-15 ENCOUNTER — Observation Stay
Admission: EM | Admit: 2019-03-15 | Discharge: 2019-03-16 | Disposition: A | Discharge status: Home / Self Care | Payer: Medicare HMO | Attending: Internal Medicine | Admitting: Internal Medicine

## 2019-03-15 ENCOUNTER — Encounter: Payer: Self-pay | Admitting: Emergency Medicine

## 2019-03-15 ENCOUNTER — Other Ambulatory Visit: Payer: Self-pay

## 2019-03-15 DIAGNOSIS — I160 Hypertensive urgency: Secondary | ICD-10-CM | POA: Diagnosis present

## 2019-03-15 DIAGNOSIS — E782 Mixed hyperlipidemia: Secondary | ICD-10-CM | POA: Diagnosis present

## 2019-03-15 DIAGNOSIS — Z20828 Contact with and (suspected) exposure to other viral communicable diseases: Secondary | ICD-10-CM | POA: Diagnosis not present

## 2019-03-15 DIAGNOSIS — J432 Centrilobular emphysema: Secondary | ICD-10-CM | POA: Diagnosis present

## 2019-03-15 DIAGNOSIS — Z7982 Long term (current) use of aspirin: Secondary | ICD-10-CM | POA: Insufficient documentation

## 2019-03-15 DIAGNOSIS — Z79899 Other long term (current) drug therapy: Secondary | ICD-10-CM | POA: Insufficient documentation

## 2019-03-15 DIAGNOSIS — I10 Essential (primary) hypertension: Secondary | ICD-10-CM | POA: Diagnosis not present

## 2019-03-15 DIAGNOSIS — Z716 Tobacco abuse counseling: Secondary | ICD-10-CM

## 2019-03-15 DIAGNOSIS — R531 Weakness: Secondary | ICD-10-CM

## 2019-03-15 DIAGNOSIS — R69 Illness, unspecified: Secondary | ICD-10-CM | POA: Diagnosis not present

## 2019-03-15 DIAGNOSIS — R0902 Hypoxemia: Secondary | ICD-10-CM | POA: Diagnosis not present

## 2019-03-15 DIAGNOSIS — R29810 Facial weakness: Secondary | ICD-10-CM | POA: Diagnosis not present

## 2019-03-15 DIAGNOSIS — G459 Transient cerebral ischemic attack, unspecified: Principal | ICD-10-CM | POA: Diagnosis present

## 2019-03-15 DIAGNOSIS — I712 Thoracic aortic aneurysm, without rupture, unspecified: Secondary | ICD-10-CM | POA: Diagnosis present

## 2019-03-15 DIAGNOSIS — E785 Hyperlipidemia, unspecified: Secondary | ICD-10-CM | POA: Insufficient documentation

## 2019-03-15 DIAGNOSIS — Z888 Allergy status to other drugs, medicaments and biological substances status: Secondary | ICD-10-CM | POA: Diagnosis not present

## 2019-03-15 DIAGNOSIS — I739 Peripheral vascular disease, unspecified: Secondary | ICD-10-CM | POA: Diagnosis not present

## 2019-03-15 DIAGNOSIS — Z720 Tobacco use: Secondary | ICD-10-CM

## 2019-03-15 DIAGNOSIS — R7303 Prediabetes: Secondary | ICD-10-CM | POA: Insufficient documentation

## 2019-03-15 DIAGNOSIS — F17210 Nicotine dependence, cigarettes, uncomplicated: Secondary | ICD-10-CM | POA: Diagnosis not present

## 2019-03-15 DIAGNOSIS — J984 Other disorders of lung: Secondary | ICD-10-CM | POA: Diagnosis not present

## 2019-03-15 HISTORY — DX: Peripheral vascular disease, unspecified: I73.9

## 2019-03-15 HISTORY — DX: Chronic obstructive pulmonary disease, unspecified: J44.9

## 2019-03-15 HISTORY — DX: Other specified diseases of spinal cord: G95.89

## 2019-03-15 HISTORY — DX: Transient cerebral ischemic attack, unspecified: G45.9

## 2019-03-15 LAB — CBC WITH DIFFERENTIAL/PLATELET
Abs Immature Granulocytes: 0.02 10*3/uL (ref 0.00–0.07)
Basophils Absolute: 0 10*3/uL (ref 0.0–0.1)
Basophils Relative: 1 %
Eosinophils Absolute: 0.2 10*3/uL (ref 0.0–0.5)
Eosinophils Relative: 2 %
HCT: 37 % (ref 36.0–46.0)
Hemoglobin: 13 g/dL (ref 12.0–15.0)
Immature Granulocytes: 0 %
Lymphocytes Relative: 29 %
Lymphs Abs: 2.5 10*3/uL (ref 0.7–4.0)
MCH: 30.4 pg (ref 26.0–34.0)
MCHC: 35.1 g/dL (ref 30.0–36.0)
MCV: 86.4 fL (ref 80.0–100.0)
Monocytes Absolute: 0.7 10*3/uL (ref 0.1–1.0)
Monocytes Relative: 8 %
Neutro Abs: 5.3 10*3/uL (ref 1.7–7.7)
Neutrophils Relative %: 60 %
Platelets: 303 10*3/uL (ref 150–400)
RBC: 4.28 MIL/uL (ref 3.87–5.11)
RDW: 13.1 % (ref 11.5–15.5)
WBC: 8.7 10*3/uL (ref 4.0–10.5)
nRBC: 0 % (ref 0.0–0.2)

## 2019-03-15 LAB — SARS CORONAVIRUS 2 (TAT 6-24 HRS): SARS Coronavirus 2: NEGATIVE

## 2019-03-15 LAB — BASIC METABOLIC PANEL
Anion gap: 10 (ref 5–15)
BUN: 7 mg/dL — ABNORMAL LOW (ref 8–23)
CO2: 25 mmol/L (ref 22–32)
Calcium: 8.9 mg/dL (ref 8.9–10.3)
Chloride: 95 mmol/L — ABNORMAL LOW (ref 98–111)
Creatinine, Ser: 0.56 mg/dL (ref 0.44–1.00)
GFR calc Af Amer: >60 mL/min (ref >60)
GFR calc non Af Amer: >60 mL/min (ref >60)
Glucose, Bld: 117 mg/dL — ABNORMAL HIGH (ref 70–99)
Potassium: 3.5 mmol/L (ref 3.5–5.1)
Sodium: 130 mmol/L — ABNORMAL LOW (ref 135–145)

## 2019-03-15 LAB — TROPONIN I (HIGH SENSITIVITY): Troponin I (High Sensitivity): 6 ng/L (ref <18)

## 2019-03-15 MED ORDER — LABETALOL HCL 5 MG/ML IV SOLN: 10.0000 mg | Freq: Four times a day (QID) | INTRAVENOUS | Status: DC | PRN

## 2019-03-15 MED ORDER — ACETAMINOPHEN 650 MG RE SUPP: 650.0000 mg | RECTAL | Status: DC | PRN

## 2019-03-15 MED ORDER — ACETAMINOPHEN 325 MG PO TABS
650.0000 mg | ORAL_TABLET | ORAL | Status: DC | PRN
  Administered 2019-03-15: 650 mg via ORAL
  Filled 2019-03-15: qty 2

## 2019-03-15 MED ORDER — ASPIRIN EC 81 MG PO TBEC
81.0000 mg | DELAYED_RELEASE_TABLET | Freq: Every day | ORAL | Status: DC
  Administered 2019-03-16: 09:00:00 81 mg via ORAL
  Filled 2019-03-15: qty 1

## 2019-03-15 MED ORDER — AMLODIPINE BESYLATE 5 MG PO TABS
5.0000 mg | ORAL_TABLET | Freq: Every day | ORAL | Status: DC
  Administered 2019-03-15 – 2019-03-16 (×2): 5 mg via ORAL
  Filled 2019-03-15 (×3): qty 1

## 2019-03-15 MED ORDER — BACLOFEN 10 MG PO TABS
10.0000 mg | ORAL_TABLET | Freq: Two times a day (BID) | ORAL | Status: DC
  Administered 2019-03-15: 10 mg via ORAL
  Filled 2019-03-15 (×3): qty 1

## 2019-03-15 MED ORDER — ROSUVASTATIN CALCIUM 5 MG PO TABS: 5.0000 mg | ORAL_TABLET | Freq: Every day | ORAL | Status: DC

## 2019-03-15 MED ORDER — ENOXAPARIN SODIUM 40 MG/0.4ML ~~LOC~~ SOLN
40.0000 mg | SUBCUTANEOUS | Status: DC
  Filled 2019-03-15: qty 0.4

## 2019-03-15 MED ORDER — STROKE: EARLY STAGES OF RECOVERY BOOK
Freq: Once | Status: AC
  Administered 2019-03-15: 21:00:00

## 2019-03-15 MED ORDER — ASPIRIN 81 MG PO CHEW
243.0000 mg | CHEWABLE_TABLET | Freq: Once | ORAL | Status: AC
  Administered 2019-03-15: 17:00:00 243 mg via ORAL
  Filled 2019-03-15: qty 3

## 2019-03-15 MED ORDER — ACETAMINOPHEN 160 MG/5ML PO SOLN
650.0000 mg | ORAL | Status: DC | PRN
  Filled 2019-03-15: qty 20.3

## 2019-03-15 NOTE — H&P (Signed)
History and Physical  Patient Name: Chelsea Santana     GBT:[id number]    DOB: 1950/07/24    DOA: 03/15/2019 PCP: [name], MD   Patient coming from: Home     Chief Complaint: Left-sided weakness  HPI: Chelsea Santana is a 68 y.o. F with HTN, refuses treatment, remote thymoma, active smoker, COPD not on O2, thoracic spinal lesion, without hemiparesis, and thoracic aortic aneurysm who presented with acute left hemiparesis.  The patient was at home today in the kitchen when she started to feel unwell.  She was reaching for a box of crackers when all of a sudden her entire left side was like "a rag doll".  She slumped into a chair, and could not lift her left arm.  After moment she was able to walk with her walker to the bathroom, where the left-sided hemiparesis recurred.  She thought she may have some trouble with word finding while this was occurring.  By the time EMS arrived her symptoms resolved.  In the ER, afebrile, blood pressure 201/83, electrolytes, renal function, WBC normal.  CT head unremarkable.  ECG showed normal sinus rhythm.  She was given a full aspirin, and the hospitalist service were asked to evaluate for TIA.           Review of systems:  Review of Systems  Constitutional: Negative for chills, fever and malaise/fatigue.  Eyes: Negative for blurred vision.  Respiratory: Negative for cough, sputum production, shortness of breath and wheezing.   Cardiovascular: Negative for chest pain, palpitations, orthopnea, leg swelling and PND.  Gastrointestinal: Negative for abdominal pain, diarrhea, nausea and vomiting.  Genitourinary: Negative for dysuria, frequency, hematuria and urgency.  Musculoskeletal: Negative for neck pain.  Neurological: Positive for dizziness, speech change and focal weakness. Negative for tingling, tremors, sensory change, seizures, loss of consciousness, weakness and headaches.  All other systems reviewed and are negative.        Past  Medical History:  Diagnosis Date  . Allergy   . Cancer (HCC)    Thymoma, Stage II  . COPD (chronic obstructive pulmonary disease) ([place])   . Hyperlipidemia   . Hypertension   . Neuropathy 07/06/2017   08/2017 NCS/EMG   SUMMARY:  1. NCS - The left sural sensory response was normal. The left   peroneal motor and tibial motor responses were normal with normal   F-waves.  2. EMG - Concentric needle examination of the left lower   extremity was performed. All muscles tested were normal.     CONCLUSION: This is a normal study. There is no   electrophysiologic evidence of a left lower extrem  . Peripheral vascular disease ([place])   . Spinal cord mass ([place])   . TIA (transient ischemic attack)     Past Surgical History:  Procedure Laterality Date  . CERVICAL CONE BIOPSY    . KNEE ARTHROSCOPY Bilateral   . THYMECTOMY  07/2014  . TONSILLECTOMY    . TUBAL LIGATION      Social History: Patient lives with her family.  Patient walks unassisted.  She  reports that she has been smoking cigarettes. She has a 48.00 pack-year smoking history. She has never used smokeless tobacco. She reports that she does not drink alcohol or use drugs.  Allergies  Allergen Reactions  . Antihistamines, Chlorpheniramine-Type Other (See Comments)    Makes her feel high  . Oxymetazoline Other (See Comments)    Makes her "high"  . Prednisone     lethargy and  anger  . Statins Other (See Comments)    Muscle Cramps   . Lipoic Acid Other (See Comments)    Numbness in hands    Family history: family history includes Diabetes in her brother and sister; Hypertension in her brother, maternal uncle, sister, and sister; Lung cancer in her father; Multiple myeloma in her mother; Stroke in her mother.  Prior to Admission medications   Medication Sig Start Date End Date Taking? Authorizing Provider  albuterol (PROAIR HFA) 108 (90 Base) MCG/ACT inhaler Inhale 2 puffs into the lungs every 4 (four) hours as needed for  wheezing or shortness of breath. 09/30/18   [name], MD  aspirin EC 81 MG tablet Take 81 mg by mouth daily.    [provider]  baclofen (LIORESAL) 10 MG tablet Take 1 tablet (10 mg total) by mouth 2 (two) times daily. 09/30/18   [name], MD  Cholecalciferol (VITAMIN D) 2000 UNITS CAPS Take 1 capsule by mouth daily.    [provider]  diclofenac (VOLTAREN) 50 MG EC tablet Take 1 tablet (50 mg total) by mouth 3 (three) times daily. 01/12/19   [name], MD  Multiple Vitamins-Minerals (WOMENS 50+ [place] VITAMIN/MIN) TABS Take by mouth.    [provider]  OMEGA-3 FATTY ACIDS PO Take by mouth.    [provider]  rosuvastatin (CRESTOR) 5 MG tablet Take 1 tablet (5 mg total) by mouth at bedtime. Patient not taking: Reported on 09/01/2018 08/19/18 11/17/18  [name], MD     Physical Exam: BP (!) 196/86 (BP Location: Right Arm)   Pulse 81   Temp 98.3 F (36.8 C) (Oral)   Resp (!) 23   Ht '5\' 7"'$  (1.702 m)   Wt 99.8 kg   SpO2 97%   BMI 34.46 kg/m  General appearance: Well-developed, adult female, alert and in no acute distress.   Eyes: Anicteric, conjunctiva pink, lids and lashes normal. PERRL.    ENT: No nasal deformity, discharge, epistaxis.  Hearing normal. OP moist without lesions.   Dentition good repair.  Lips normal. Skin: Warm and dry.  No jaundice.  No suspicious rashes or lesions. Cardiac: RRR, nl S1-S2, no murmurs appreciated.  Capillary refill is brisk.  JVP normal.  No LE edema.  Radial and DP pulses 2+ and symmetric.  No carotid bruits. Respiratory: Normal respiratory rate and rhythm.  CTAB without rales or wheezes. GI: Abdomen soft without rigidity.  No TTP. No ascites, distension, no hepatosplenomegaly.   MSK: No deformities or effusions. Neuro: Pupils are 4 mm and reactive to 3 mm.  Extraocular movements are intact, without nystagmus.  Cranial nerve 5 is within normal limits.  Cranial nerve 7 is symmetrical.   Cranial nerve 8 is within normal limits.  Cranial nerves 9 and 10 reveal equal palate elevation.  Cranial nerve 11 reveals sternocleidomastoid strong.  Cranial nerve 12 is midline. I do not note a deficit in motor strength testing in the upper and lower extremities bilaterally with normal motor, tone and bulk. Romberg maneuver is negative for pathology.  Finger-to-nose testing is within normal limits.  Speech is fluent.  Naming is grossly intact.  Attention span and concentration are within normal limits.   Psych: The patient is oriented to time, place and person.  Behavior appropriate.  Affect normal.  Recall, recent and remote, as well as general fund of knowledge seem within normal limits.  No evidence of aural or visual hallucinations or delusions.  Labs on Admission:  I have personally reviewed following labs and imaging studies: CBC: Recent Labs  Lab 03/15/19 1553  WBC 8.7  NEUTROABS 5.3  HGB 13.0  HCT 37.0  MCV 86.4  PLT 242   Basic Metabolic Panel: Recent Labs  Lab 03/15/19 1553  NA 130*  K 3.5  CL 95*  CO2 25  GLUCOSE 117*  BUN 7*  CREATININE 0.56  CALCIUM 8.9   GFR: Estimated Creatinine Clearance: 84 mL/min (by C-G formula based on SCr of 0.56 mg/dL).     Radiological Exams on Admission: Personally reviewed CT head report unremarkable; CXR personally reviewed shows no focal airspace disease or opacity: Ct Head Wo Contrast  Result Date: 03/15/2019 CLINICAL DATA:  68 year old female with acute LEFT arm and leg weakness today. EXAM: CT HEAD WITHOUT CONTRAST TECHNIQUE: Contiguous axial images were obtained from the base of the skull through the vertex without intravenous contrast. COMPARISON:  None. FINDINGS: Brain: No evidence of acute infarction, hemorrhage, hydrocephalus, extra-axial collection or mass lesion/mass effect. Mild chronic small-vessel white matter ischemic changes noted. Vascular: No hyperdense vessel or unexpected calcification. Skull: Normal.  Negative for fracture or focal lesion. Sinuses/Orbits: No acute finding. Other: None. IMPRESSION: 1. No evidence of acute intracranial abnormality. 2. Mild chronic small-vessel white matter ischemic changes. Electronically Signed   By: Margarette Canada M.D.   On: 03/15/2019 16:33   Dg Chest Portable 1 View  Result Date: 03/15/2019 CLINICAL DATA:  Sudden onset left-sided weakness EXAM: PORTABLE CHEST 1 VIEW COMPARISON:  06/02/2017 FINDINGS: Cardiac shadow is stable. Aortic calcifications are again seen. The lungs are well aerated bilaterally. Some mild scarring is noted stable in the left base. No new focal abnormality is seen. IMPRESSION: Stable scarring in the left base. No acute abnormality is noted. Electronically Signed   By: Inez Catalina M.D.   On: 03/15/2019 16:40     EKG: Independently reviewed. Rate 77, QTc normal, no ST changes, sinus rhythm.        Assessment/Plan  Acute left sided hemiparesis, TIA This is new.  ABCD score 5 for age, symptoms, duration >10 min, BP -Admit to telemetry -Neuro checks, NIHSS per protocol -Obtain MRI brain -Obtain Carotid dopplers  -Echocardiogram ordered  -Daily aspirin 81 -Start amlodipine  -Patient reluctantly willing to start Crestor every other day, as prescribed by Dr. Rockey Situ in May 2020, but wishes to start this after discharge, will need script on discharge  -Obtain Lipids, hemoglobin A1c  -PT consultation -Smoking cessation recommended -Consult to Neurology, appreciate recommendations   Essential hypertension Hypertensive urgency  Patient has been reluctant to start medicines in the past.  Willing to try amlodipine now.  BP still elevated in the ER -Start amlodipine 5 mg   Hyperlipidemia  -Start Crestor at discharge, follow up with Dr. Rockey Situ  History of Thoracic aortic aneurysm Followed by Drs. Army Melia and Gollan   Spinal cord lesion  Follows at [facility].  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  Causes pain usually on right side,  no weakness.  COPD  Smoking Stable. No active disease -Smoking cessation recommended, nursing teaching ordered         DVT prophylaxis: Lovenox  Code Status: FULL  Family Communication: son at bedisde  Disposition Plan: Anticipate Stroke work up as above and consult to ancillary services.  Expect discharge within 24 hours. Consults called: None Admission status: Telemetry, OBS status  Core measures: -VTE prophylaxis ordered at time of admission -Aspirin ordered at admission -Atrial fibrillation: not present in  ER -tPA not given because of symptoms resolved -Dysphagia screen ordered in ER -Lipids ordered -PT eval ordered -Smoking cessation recommended    Medical decision making: Patient seen at 5:58 PM on 03/15/2019.  The patient was discussed with Dr. Charna Archer. What exists of the patient's chart was reviewed in depth and summarized above.  Clinical condition: stable.       [place]

## 2019-03-15 NOTE — ED Notes (Signed)
Patient up to [facility] to void, independent, no issues.

## 2019-03-15 NOTE — ED Notes (Signed)
At patient's request, lowered left side stretcher rail and placed call bell in reach.

## 2019-03-15 NOTE — ED Triage Notes (Signed)
Pt presents to ED via [facility] from home c/o sudden onset L-sided weakness that has resolved completely on arrival. Pt states this has happened before, but when asked what caused it, pt does not answer. BP 201/83, pt states she does not take HTN meds. x1 81mg  ASA taken prior to calling EMS today. Current smoker. Pt agitated on arrival, appears irritated at EMS and nursing staff. Pt heard yelling for EMS not to call CPS prior to their departure.

## 2019-03-15 NOTE — ED Notes (Signed)
Pt refusing to take medication at this time. Pt states she is not taking any medication until she gets some food. Pt upset and states she needs to eat something because she hasn't eaten all day. Pt advised that we are unable to located a diet order at this time. Pt transported to floor at this time and advised that we were waiting until MRI complete.

## 2019-03-15 NOTE — ED Notes (Addendum)
Asked pt if she was coming to ED from home or work, pt rolled eyes, scoffed, and asked "Can I have somebody else?" 2nd RN Lana to complete EKG and temp for triage. Pt already seen by EDP. Charge RN notified pt refusing this Probation officer at this time.

## 2019-03-15 NOTE — ED Provider Notes (Signed)
[facility] Emergency Department Provider Note   ____________________________________________   First MD Initiated Contact with Patient 03/15/19 1537     (approximate)  I have reviewed the triage vital signs and the nursing notes.   HISTORY  Chief Complaint Transient Ischemic Attack    HPI Chelsea Santana is a 68 y.o. female with past medical history of hypertension, hyperlipidemia, and thymoma who presents to the ED complaining of weakness.  Patient reports that approximately 1 hour prior to arrival she had acute onset of left-sided weakness.  She states that she was reaching for something in her kitchen when suddenly felt her self fall to one side and could make her arm do what she wanted it to.  She also felt like she had a hard time speaking with the onset of the symptoms.  She was able to sit down and symptoms briefly improved.  She was able to call EMS, but had another episode of left-sided weakness just before their arrival.  Weakness seemed to resolve by the time EMS arrived.  She states she feels like her strength and speech is back to normal, denies any numbness.  She has otherwise been feeling well recently with no fevers, cough, chest pain, or shortness of breath.  She does admit to ongoing tobacco smoking.        Past Medical History:  Diagnosis Date  . Allergy   . Cancer (HCC)    Thymoma, Stage II  . Hyperlipidemia   . Hypertension   . Neuropathy 07/06/2017   08/2017 NCS/EMG   SUMMARY:  1. NCS - The left sural sensory response was normal. The left   peroneal motor and tibial motor responses were normal with normal   F-waves.  2. EMG - Concentric needle examination of the left lower   extremity was performed. All muscles tested were normal.     CONCLUSION: This is a normal study. There is no   electrophysiologic evidence of a left lower extrem    Patient Active Problem List   Diagnosis Date Noted  . TIA (transient ischemic attack)  03/15/2019  . Centrilobular emphysema ([place]) 07/08/2018  . Thoracic aortic aneurysm without rupture ([place]) 07/08/2018  . Lumbar back pain with radiculopathy affecting right lower extremity 10/12/2017  . Multiple thyroid nodules 05/21/2016  . Plantar fasciitis 03/11/2016  . Coronary artery calcification seen on CT scan 05/20/2015  . Disease of spinal cord ([place]) 03/06/2015  . Degeneration of intervertebral disc of lumbar region 02/17/2015  . H/O abnormal cervical Papanicolaou smear 02/17/2015  . Compulsive tobacco user syndrome 02/17/2015  . Vitamin D deficiency 02/17/2015  . Thymoma 07/17/2014  . Leg weakness 04/14/2013  . Cardiac murmur 08/31/2012  . Essential hypertension 08/31/2012  . CA of skin 08/31/2012    Past Surgical History:  Procedure Laterality Date  . CERVICAL CONE BIOPSY    . KNEE ARTHROSCOPY Bilateral   . THYMECTOMY  07/2014  . TONSILLECTOMY    . TUBAL LIGATION      Prior to Admission medications   Medication Sig Start Date End Date Taking? Authorizing Provider  albuterol (PROAIR HFA) 108 (90 Base) MCG/ACT inhaler Inhale 2 puffs into the lungs every 4 (four) hours as needed for wheezing or shortness of breath. 09/30/18   [name], MD  aspirin EC 81 MG tablet Take 81 mg by mouth daily.    [provider]  baclofen (LIORESAL) 10 MG tablet Take 1 tablet (10 mg total) by mouth 2 (two) times daily.  09/30/18   [name], MD  Cholecalciferol (VITAMIN D) 2000 UNITS CAPS Take 1 capsule by mouth daily.    [provider]  diclofenac (VOLTAREN) 50 MG EC tablet Take 1 tablet (50 mg total) by mouth 3 (three) times daily. 01/12/19   [name], MD  Multiple Vitamins-Minerals (WOMENS 50+ Goldonna VITAMIN/MIN) TABS Take by mouth.    [provider]  OMEGA-3 FATTY ACIDS PO Take by mouth.    [provider]  rosuvastatin (CRESTOR) 5 MG tablet Take 1 tablet (5 mg total) by mouth at bedtime. Patient not taking: Reported on 09/01/2018  08/19/18 11/17/18  [name], MD    Allergies Antihistamines, chlorpheniramine-type; Oxymetazoline; Prednisone; Statins; and Lipoic acid  Family History  Problem Relation Age of Onset  . Multiple myeloma Mother   . Stroke Mother   . Lung cancer Father   . Diabetes Sister   . Hypertension Sister   . Diabetes Brother   . Hypertension Brother   . Hypertension Maternal Uncle   . Hypertension Sister     Social History Social History   Tobacco Use  . Smoking status: Current Every Day Smoker    Packs/day: 1.00    Years: 48.00    Pack years: 48.00    Types: Cigarettes  . Smokeless tobacco: Never Used  Substance Use Topics  . Alcohol use: No    Alcohol/week: 0.0 standard drinks  . Drug use: No    Review of Systems  Constitutional: No fever/chills Eyes: No visual changes. ENT: No sore throat. Cardiovascular: Denies chest pain. Respiratory: Denies shortness of breath. Gastrointestinal: No abdominal pain.  No nausea, no vomiting.  No diarrhea.  No constipation. Genitourinary: Negative for dysuria. Musculoskeletal: Negative for back pain. Skin: Negative for rash. Neurological: Negative for headaches, positive for left-sided weakness, negative for numbness.  Positive for speech changes.  ____________________________________________   PHYSICAL EXAM:  VITAL SIGNS: ED Triage Vitals  Enc Vitals Group     BP      Pulse      Resp      Temp      Temp src      SpO2      Weight      Height      Head Circumference      Peak Flow      Pain Score      Pain Loc      Pain Edu?      Excl. in [place]?     Constitutional: Alert and oriented. Eyes: Conjunctivae are normal.  Pupils equal round and reactive to light, extraocular movements intact without nystagmus bilaterally. Head: Atraumatic. Nose: No congestion/rhinnorhea. Mouth/Throat: Mucous membranes are moist. Neck: Normal ROM Cardiovascular: Normal rate, regular rhythm. Grossly normal heart sounds. Respiratory:  Normal respiratory effort.  No retractions. Lungs CTAB. Gastrointestinal: Soft and nontender. No distention. Genitourinary: deferred Musculoskeletal: No lower extremity tenderness nor edema. Neurologic:  Normal speech and language. No gross focal neurologic deficits are appreciated.  5/5 strength in bilateral upper and lower extremities, no plantar drift.  Sensation intact throughout. Skin:  Skin is warm, dry and intact. No rash noted. Psychiatric: Mood and affect are normal. Speech and behavior are normal.  ____________________________________________   LABS (all labs ordered are listed, but only abnormal results are displayed)  Labs Reviewed  BASIC METABOLIC PANEL - Abnormal; Notable for the following components:      Result Value   Sodium 130 (*)    Chloride 95 (*)  Glucose, Bld 117 (*)    BUN 7 (*)    All other components within normal limits  SARS CORONAVIRUS 2 (TAT 6-24 HRS)  CBC WITH DIFFERENTIAL/PLATELET  URINALYSIS, COMPLETE (UACMP) WITH MICROSCOPIC  TROPONIN I (HIGH SENSITIVITY)   ____________________________________________  EKG  ED ECG REPORT I, Blake Divine, the attending physician, personally viewed and interpreted this ECG.   Date: 03/15/2019  EKG Time: 16:12  Rate: 77  Rhythm: normal sinus rhythm  Axis: LAD  Intervals:none  ST&T Change: None   PROCEDURES  Procedure(s) performed (including Critical Care):  Procedures   ____________________________________________   INITIAL IMPRESSION / ASSESSMENT AND PLAN / ED COURSE       68 year old female with history of hypertension, hyperlipidemia, and tobacco use presents to the ED complaining of acute onset left-sided weakness as well as difficulty speaking in 2 separate episodes just prior to arrival.  Her symptoms seem to have now resolved and she has no focal findings on her neurologic exam.  Presentation appears consistent with TIA, will obtain CT head, check EKG and labs.  No indication for code  stroke given symptoms resolved.  We will plan for admission for further work-up.  EKG shows sinus rhythm with no acute ischemic changes and labs are unremarkable.  CT head is negative for acute process.  Patient reports taking 1 baby aspirin earlier today, was given remainder of aspirin load.  Case discussed with hospitalist, who accepts patient for admission for further work-up of TIA.      ____________________________________________   FINAL CLINICAL IMPRESSION(S) / ED DIAGNOSES  Final diagnoses:  TIA (transient ischemic attack)  Left-sided weakness  Hypertension, unspecified type     ED Discharge Orders    None       Note:  This document was prepared using Dragon voice recognition software and may include unintentional dictation errors.   Blake Divine, MD 03/15/19 1728

## 2019-03-16 ENCOUNTER — Observation Stay (HOSPITAL_BASED_OUTPATIENT_CLINIC_OR_DEPARTMENT_OTHER)
Admit: 2019-03-16 | Discharge: 2019-03-16 | Disposition: A | Discharge status: Home / Self Care | Payer: Medicare HMO | Attending: Family Medicine | Admitting: Family Medicine

## 2019-03-16 ENCOUNTER — Observation Stay: Payer: Medicare HMO

## 2019-03-16 DIAGNOSIS — I160 Hypertensive urgency: Secondary | ICD-10-CM | POA: Diagnosis not present

## 2019-03-16 DIAGNOSIS — I6523 Occlusion and stenosis of bilateral carotid arteries: Secondary | ICD-10-CM | POA: Diagnosis not present

## 2019-03-16 DIAGNOSIS — Z716 Tobacco abuse counseling: Secondary | ICD-10-CM

## 2019-03-16 DIAGNOSIS — Z720 Tobacco use: Secondary | ICD-10-CM | POA: Diagnosis not present

## 2019-03-16 DIAGNOSIS — G459 Transient cerebral ischemic attack, unspecified: Secondary | ICD-10-CM

## 2019-03-16 DIAGNOSIS — R531 Weakness: Secondary | ICD-10-CM | POA: Diagnosis not present

## 2019-03-16 LAB — HEMOGLOBIN A1C
Hgb A1c MFr Bld: 5.9 % — ABNORMAL HIGH (ref 4.8–5.6)
Mean Plasma Glucose: 122.63 mg/dL

## 2019-03-16 LAB — LIPID PANEL
Cholesterol: 247 mg/dL — ABNORMAL HIGH (ref 0–200)
HDL: 64 mg/dL (ref >40)
LDL Cholesterol: 164 mg/dL — ABNORMAL HIGH (ref 0–99)
Total CHOL/HDL Ratio: 3.9 RATIO
Triglycerides: 96 mg/dL (ref <150)
VLDL: 19 mg/dL (ref 0–40)

## 2019-03-16 LAB — ECHOCARDIOGRAM COMPLETE
Height: 67 in
Weight: 3520 oz

## 2019-03-16 LAB — URINALYSIS, COMPLETE (UACMP) WITH MICROSCOPIC
Bacteria, UA: NONE SEEN
Bilirubin Urine: NEGATIVE
Glucose, UA: NEGATIVE mg/dL
Hgb urine dipstick: NEGATIVE
Ketones, ur: NEGATIVE mg/dL
Nitrite: NEGATIVE
Protein, ur: NEGATIVE mg/dL
Specific Gravity, Urine: 1.006 (ref 1.005–1.030)
pH: 7 (ref 5.0–8.0)

## 2019-03-16 LAB — HIV ANTIBODY (ROUTINE TESTING W REFLEX): HIV Screen 4th Generation wRfx: NONREACTIVE

## 2019-03-16 MED ORDER — NICOTINE 10 MG IN INHA: 1.0000 | RESPIRATORY_TRACT | 0 refills | Status: DC | PRN

## 2019-03-16 MED ORDER — AMLODIPINE BESYLATE 5 MG PO TABS: 5.0000 mg | ORAL_TABLET | Freq: Every day | ORAL | 0 refills | Status: DC

## 2019-03-16 MED ORDER — ROSUVASTATIN CALCIUM 5 MG PO TABS: 5.0000 mg | ORAL_TABLET | Freq: Every day | ORAL | 0 refills | Status: DC

## 2019-03-16 NOTE — Progress Notes (Signed)
PT Cancellation Note  Patient Details Name: Chelsea Santana MRN: [id number] DOB: Feb 28, 1951   Cancelled Treatment:    Reason Eval/Treat Not Completed: PT screened, no needs identified, will sign off(Upon arrival to room, patient standing at window indep.  Demonstrates mobility in room (negotiating furniture/environment, changing directions) without difficulty, LOB or safety concern.  Reports functional ability returned to baseline.  No acute PT needs identified at this time.  Will complete order; please re-consult should needs change.)  Jihan Rudy H. Owens Shark, PT, DPT, NCS 03/16/19, 2:27 PM [phone]

## 2019-03-16 NOTE — Progress Notes (Signed)
*  PRELIMINARY RESULTS* Echocardiogram 2D Echocardiogram has been performed.  Chelsea Santana 03/16/2019, 9:55 AM

## 2019-03-16 NOTE — Plan of Care (Signed)
  Problem: Health Behavior/Discharge Planning: Goal: Ability to manage health-related needs will improve Outcome: Progressing   Problem: Coping: Goal: Level of anxiety will decrease Outcome: Progressing   Problem: Education: Goal: Knowledge of secondary prevention will improve Outcome: Progressing   Problem: Ischemic Stroke/TIA Tissue Perfusion: Goal: Complications of ischemic stroke/TIA will be minimized Outcome: Progressing

## 2019-03-16 NOTE — Discharge Instructions (Signed)
Transient Ischemic Attack ° °A transient ischemic attack (TIA) is a "warning stroke" that causes stroke-like symptoms that go away quickly. A TIA does not cause lasting damage to the brain. But having a TIA is a sign that you may be at risk for a stroke. Lifestyle changes and medical treatments can help prevent a stroke. °It is important to know the symptoms of a TIA and what to do. Get help right away, even if your symptoms go away. The symptoms of a TIA are the same as those of a stroke. They can happen fast, and they usually go away within minutes or hours. They can include: °· Weakness or loss of feeling in your face, arm, or leg. This often happens on one side of your body. °· Trouble walking. °· Trouble moving your arms or legs. °· Trouble talking or understanding what people are saying. °· Trouble seeing. °· Seeing two of one object (double vision). °· Feeling dizzy. °· Feeling confused. °· Loss of balance or coordination. °· Feeling sick to your stomach (nauseous) and throwing up (vomiting). °· A very bad headache for no reason. °What increases the risk? °Certain things may make you more likely to have a TIA. Some of these are things that you can change, such as: °· Being very overweight (obese). °· Using products that contain nicotine or tobacco, such as cigarettes and e-cigarettes. °· Taking birth control pills. °· Not being active. °· Drinking too much alcohol. °· Using drugs. °Other risk factors include: °· Having an irregular heartbeat (atrial fibrillation). °· Being African American or Hispanic. °· Having had blood clots, stroke, TIA, or heart attack in the past. °· Being a woman with a history of high blood pressure in pregnancy (preeclampsia). °· Being over the age of 60. °· Being female. °· Having family history of stroke. °· Having the following diseases or conditions: °? High blood pressure. °? High cholesterol. °? Diabetes. °? Heart disease. °? Sickle cell disease. °? Sleep apnea. °? Migraine  headache. °? Long-term (chronic) diseases that cause soreness and swelling (inflammation). °? Disorders that affect how your blood clots. °Follow these instructions at home: °Medicines ° °· Take over-the-counter and prescription medicines only as told by your doctor. °· If you were told to take aspirin or another medicine to thin your blood, take it exactly as told by your doctor. °? Taking too much of the medicine can cause bleeding. °? Taking too little of the medicine may not work to treat the problem. °Eating and drinking ° °· Eat 5 or more servings of fruits and vegetables each day. °· Follow instructions from your doctor about your diet. You may need to follow a certain diet to help lower your risk of having a stroke. You may need to: °? Eat a diet that is low in fat and salt. °? Eat foods that contain a lot of fiber. °? Limit the amount of carbohydrates and sugar in your diet. °· Limit alcohol intake to 1 drink a day for nonpregnant women and 2 drinks a day for men. One drink equals 12 oz of beer, 5 oz of wine, or 1½ oz of hard liquor. °General instructions °· Keep a healthy weight. °· Stay active. Try to get at least 30 minutes of activity on all or most days. °· Find out if you have a condition called sleep apnea. Get treatment if needed. °· Do not use any products that contain nicotine or tobacco, such as cigarettes and e-cigarettes. If you need help quitting,   ask your doctor. °· Do not abuse drugs. °· Keep all follow-up visits as told by your doctor. This is important. °Get help right away if: °· You have any signs of stroke. "BE FAST" is an easy way to remember the main warning signs: °? B - Balance. Signs are dizziness, sudden trouble walking, or loss of balance. °? E - Eyes. Signs are trouble seeing or a sudden change in how you see. °? F - Face. Signs are sudden weakness or loss of feeling of the face, or the face or eyelid drooping on one side. °? A - Arms. Signs are weakness or loss of feeling in an  arm. This happens suddenly and usually on one side of the body. °? S - Speech. Signs are sudden trouble speaking, slurred speech, or trouble understanding what people say. °? T - Time. Time to call emergency services. Write down what time symptoms started. °· You have other signs of stroke, such as: °? A sudden, very bad headache with no known cause. °? Feeling sick to your stomach (nausea). °? Throwing up (vomiting). °? Jerky movements that you cannot control (seizure). °These symptoms may be an emergency. Do not wait to see if the symptoms will go away. Get medical help right away. Call your local emergency services (911 in the [place] Do not drive yourself to the hospital. °Summary °· A transient ischemic attack (TIA) is a "warning stroke" that causes stroke-like symptoms that go away quickly. °· A TIA is a medical emergency. Get help right away, even if your symptoms go away. °· A TIA does not cause lasting damage to the brain. °· Having a TIA is a sign that you may be at risk for a stroke. Lifestyle changes and medical treatments can help prevent a stroke. °This information is not intended to replace advice given to you by your health care provider. Make sure you discuss any questions you have with your health care provider. °Document Released: 01/01/2008 Document Revised: 12/18/2017 Document Reviewed: 06/25/2016 °Elsevier Patient Education © 2020 [employer] ° °

## 2019-03-16 NOTE — Progress Notes (Signed)
Patient up to the bathroom, pulled call light for assistance.  Patient stated that she was once again feeling like her left side was weak and rag doll like.  NIH is still a 0 at this time and VSS. Patient was helped back to bed with the assistance of this nurse and [place], [place]. Symptoms seemed to resolve within 5 mins.  [place], NP made aware and no new orders received. Clarise Cruz, BSN

## 2019-03-16 NOTE — Progress Notes (Signed)
PT Cancellation Note  Patient Details Name: Chelsea Santana MRN: [id number] DOB: 1950-10-10   Cancelled Treatment:    Reason Eval/Treat Not Completed: Patient at procedure or test/unavailable;Other (comment)(Patient consult received and reviewed. Unable to perform physical therapy evaluation at time of attempt due to patient currently being with medical provider. Will attempt again at a later time/date as available.)  Janna Arch, PT, DPT   03/16/2019, 11:20 AM

## 2019-03-16 NOTE — Discharge Summary (Addendum)
Physician Discharge Summary  Chelsea Santana [id number] DOB: 02-Mar-1951 DOA: 03/15/2019  PCP: [name], MD  Admit date: 03/15/2019 Discharge date: 03/16/2019  Admitted From: Home Disposition: Home  Recommendations for Outpatient Follow-up:  Follow up with PCP in 1 week  Home Health: None Equipment/Devices: None  Discharge Condition: Fair CODE STATUS: Full code Diet recommendation: Heart Healthy    Discharge Diagnoses:  Principal Problem:   TIA (transient ischemic attack)  Active Problems:   Hyperlipidemia   Essential hypertension   Centrilobular emphysema ([place])   Thoracic aortic aneurysm without rupture ([place])   Hypertensive urgency   Tobacco abuse counseling  Brief narrative/HPI 68 year old female with hypertension (refusing medication), COPD not on home O2 with active smoking (1 pack/day), remote thymoma, thoracic aortic aneurysm presented with acute left-sided weakness.  On the day of admission she was in her kitchen when she reached out to a box of crackers and suddenly felt her entire left side by her Haldol.  She slumped onto the chair and could not lift her left arm.  After some time she was able to walk with a walker when she again had left-sided weakness.  Also felt she had some word finding difficulty.  Her symptoms had resolved after EMS had arrived.  In the ED she was in hypertensive urgency with blood pressure 201/83 mmHg.  Remaining vitals were stable with normal labs.  Head CT negative for acute findings.  Patient given full dose aspirin and placed in observation for TIA.  Hospital course Principal problem  Transient ischemic attack No further symptoms or residual weakness.  Head CT unremarkable.  MRI of the brain without any acute ischemia or intracranial abnormality and shows mild chronic small vessel ischemic disease.  She does have tiny chronic lacunar infarct within the left caudate head.  Carotid ultrasound with <50% stenosis bilaterally.  2D echo  with EF of 65 to 70% and no wall motion abnormality. Patient started on aspirin 81 mg daily. LDL of 164.  Patient was prescribed Crestor by her cardiologist but nonadherent.  Finally agrees to resume low-dose Crestor (5 mg daily).  Refuses to take higher dose stating that she had severe muscle cramping when she was on Crestor previously.   Counseled and educated on secondary stroke prevention. Added amlodipine 5 mg daily for blood pressure control and instructed to monitor blood pressure at home and keep a log of it and show it to her PCP during outpatient follow-up.  Active problems Hypertensive urgency Refused medication in the past.  Now agrees with taking meds and started on oral amlodipine.  Blood pressure currently stable.  Counseled on diet and smoking cessation  Prediabetes New diagnosis.  A1c of 5.9.  Needs dietary counseling and follow-up as outpatient.  Spinal cord lesion Follows at [facility].  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  Reports occasional pain on the right side with no weakness.  History of thoracic aortic aneurysm Follows with cardiologist and PCP.  Tobacco abuse Smokes 1 pack/day.  Counseled strongly on cessation.  Reports nicotine inhaler have helped her in the past and I have written a prescription for it.   Patient stable to be discharged home with outpatient PCP follow-up  Procedures: CT head, MRI brain, 2D echo, carotid Doppler Family communication: None Disposition: Home  Discharge Instructions   Allergies as of 03/16/2019      Reactions   Antihistamines, Chlorpheniramine-type Other (See Comments)   Makes her feel high   Oxymetazoline Other (See Comments)   Makes her "high"  Prednisone    lethargy and anger   Statins Other (See Comments)   Muscle Cramps   Lipoic Acid Other (See Comments)   Numbness in hands      Medication List    TAKE these medications   albuterol 108 (90 Base) MCG/ACT inhaler Commonly known as: ProAir HFA Inhale 2 puffs  into the lungs every 4 (four) hours as needed for wheezing or shortness of breath.   amLODipine 5 MG tablet Commonly known as: NORVASC Take 1 tablet (5 mg total) by mouth daily. Start taking on: March 17, 2019   aspirin EC 81 MG tablet Take 81 mg by mouth daily.   baclofen 10 MG tablet Commonly known as: LIORESAL Take 5 mg by mouth at bedtime.   diclofenac 50 MG EC tablet Commonly known as: VOLTAREN Take 1 tablet (50 mg total) by mouth 3 (three) times daily. What changed: when to take this   nicotine 10 MG inhaler Commonly known as: NICOTROL Inhale 1 Cartridge (1 continuous puffing total) into the lungs as needed for smoking cessation.   OMEGA-3 FATTY ACIDS PO Take by mouth.   rosuvastatin 5 MG tablet Commonly known as: CRESTOR Take 1 tablet (5 mg total) by mouth daily at 6 PM. Start taking on: March 17, 2019 What changed: when to take this   Vitamin D 50 MCG (2000 UT) Caps Take 1 capsule by mouth daily.   Womens 50+ Multi Vitamin/Min Tabs Take by mouth.      Follow-up Information    [name], MD. Schedule an appointment as soon as possible for a visit in 1 week(s).   Specialty: Internal Medicine Contact information: [address] [place] [address] [phone]          Allergies  Allergen Reactions  . Antihistamines, Chlorpheniramine-Type Other (See Comments)    Makes her feel high  . Oxymetazoline Other (See Comments)    Makes her "high"  . Prednisone     lethargy and anger  . Statins Other (See Comments)    Muscle Cramps   . Lipoic Acid Other (See Comments)    Numbness in hands        Procedures/Studies: Ct Head Wo Contrast  Result Date: 03/15/2019 CLINICAL DATA:  68 year old female with acute LEFT arm and leg weakness today. EXAM: CT HEAD WITHOUT CONTRAST TECHNIQUE: Contiguous axial images were obtained from the base of the skull through the vertex without intravenous contrast. COMPARISON:  None. FINDINGS:  Brain: No evidence of acute infarction, hemorrhage, hydrocephalus, extra-axial collection or mass lesion/mass effect. Mild chronic small-vessel white matter ischemic changes noted. Vascular: No hyperdense vessel or unexpected calcification. Skull: Normal. Negative for fracture or focal lesion. Sinuses/Orbits: No acute finding. Other: None. IMPRESSION: 1. No evidence of acute intracranial abnormality. 2. Mild chronic small-vessel white matter ischemic changes. Electronically Signed   By: Margarette Canada M.D.   On: 03/15/2019 16:33   Mr Brain Wo Contrast  Result Date: 03/15/2019 CLINICAL DATA:  TIA, initial exam. Additional history provided: Acute left hemiparesis. EXAM: MRI HEAD WITHOUT CONTRAST TECHNIQUE: Multiplanar, multiecho pulse sequences of the brain and surrounding structures were obtained without intravenous contrast. COMPARISON:  Head CT performed earlier the same day 03/15/2019 FINDINGS: Brain: There is no evidence of acute infarct. No evidence of intracranial mass. No midline shift or extra-axial fluid collection. No chronic intracranial blood products. Mild scattered T2/FLAIR hyperintensity within the cerebral white matter is nonspecific, but consistent with chronic small vessel ischemic disease. Tiny chronic lacunar infarct  within the left caudate head. Cerebral volume is normal for age. Vascular: Flow voids maintained within the proximal large arterial vessels. Skull and upper cervical spine: No focal marrow lesion. Sinuses/Orbits: Visualized orbits demonstrate no acute abnormality. Trace ethmoid sinus mucosal thickening. No significant mastoid effusion. IMPRESSION: 1. No evidence of acute intracranial abnormality. 2. Mild chronic small vessel ischemic disease. Tiny chronic lacunar infarct within the left caudate head. Electronically Signed   By: Kellie Simmering DO   On: 03/15/2019 19:09   US Carotid Bilateral (at [facility] Only)  Result Date: 03/16/2019 CLINICAL DATA:  TIA. EXAM: BILATERAL CAROTID  DUPLEX ULTRASOUND TECHNIQUE: Pearline Cables scale imaging, color Doppler and duplex ultrasound were performed of bilateral carotid and vertebral arteries in the neck. COMPARISON:  None FINDINGS: Criteria: Quantification of carotid stenosis is based on velocity parameters that correlate the residual internal carotid diameter with NASCET-based stenosis levels, using the diameter of the distal internal carotid lumen as the denominator for stenosis measurement. The following velocity measurements were obtained: RIGHT ICA: 110/35 cm/sec CCA: [id number] cm/sec SYSTOLIC ICA/CCA RATIO:  1.8 ECA: 84 cm/sec LEFT ICA: 103/38 cm/sec CCA: [id number] cm/sec SYSTOLIC ICA/CCA RATIO:  1.5 ECA: 126 cm/sec RIGHT CAROTID ARTERY: Small amount of echogenic plaque at the carotid bulb and distal common carotid artery. External carotid artery is patent with normal waveform. Small amount of echogenic plaque in the proximal internal carotid artery. Normal waveforms and velocities in the internal carotid artery. RIGHT VERTEBRAL ARTERY: Antegrade flow and normal waveform in the right vertebral artery. LEFT CAROTID ARTERY: Small amount of heterogeneous plaque at the left carotid bulb. External carotid artery is patent with normal waveform. Small amount of heterogeneous plaque in the proximal internal carotid artery. Normal waveforms and velocities in the internal carotid artery. LEFT VERTEBRAL ARTERY: Antegrade flow and normal waveform in the left vertebral artery. IMPRESSION: 1. Mild atherosclerotic disease in the bilateral carotid arteries. Estimated degree of stenosis in the internal carotid arteries is less than 50% bilaterally. 2. Patent vertebral arteries with antegrade flow. Electronically Signed   By: Markus Daft M.D.   On: 03/16/2019 08:39   Dg Chest Portable 1 View  Result Date: 03/15/2019 CLINICAL DATA:  Sudden onset left-sided weakness EXAM: PORTABLE CHEST 1 VIEW COMPARISON:  06/02/2017 FINDINGS: Cardiac shadow is stable. Aortic calcifications are  again seen. The lungs are well aerated bilaterally. Some mild scarring is noted stable in the left base. No new focal abnormality is seen. IMPRESSION: Stable scarring in the left base. No acute abnormality is noted. Electronically Signed   By: Inez Catalina M.D.   On: 03/15/2019 16:40    Subjective: No further left-sided weakness or speech impairment.  Discharge Exam: Vitals:   03/16/19 0339 03/16/19 0917  BP: 127/65 (!) 154/87  Pulse: 72 81  Resp: 17 20  Temp: 98.3 F (36.8 C) 98.6 F (37 C)  SpO2: 95% 96%   Vitals:   03/15/19 2332 03/16/19 0120 03/16/19 0339 03/16/19 0917  BP: (!) 121/56 135/75 127/65 (!) 154/87  Pulse: 74 81 72 81  Resp: 16 18 17 20   Temp: 98.3 F (36.8 C) 98.5 F (36.9 C) 98.3 F (36.8 C) 98.6 F (37 C)  TempSrc: Oral Oral Oral Oral  SpO2: 93% 96% 95% 96%  Weight:      Height:        General: Elderly female not in distress HEENT: Moist mucosa, supple neck Chest: Clear bilaterally CVs: Normal S1-S2 GI: Soft, nondistended, nontender Musculoskeletal: Warm, no edema CNS: Alert oriented,  no focal deficit   The results of significant diagnostics from this hospitalization (including imaging, microbiology, ancillary and laboratory) are listed below for reference.     Microbiology: Recent Results (from the past 240 hour(s))  SARS CORONAVIRUS 2 (TAT 6-24 HRS) Nasopharyngeal Nasopharyngeal Swab     Status: None   Collection Time: 03/15/19  5:18 PM   Specimen: Nasopharyngeal Swab  Result Value Ref Range Status   SARS Coronavirus 2 NEGATIVE NEGATIVE Final    Comment: (NOTE) SARS-CoV-2 target nucleic acids are NOT DETECTED. The SARS-CoV-2 RNA is generally detectable in upper and lower respiratory specimens during the acute phase of infection. Negative results do not preclude SARS-CoV-2 infection, do not rule out co-infections with other pathogens, and should not be used as the sole basis for treatment or other patient management decisions. Negative  results must be combined with clinical observations, patient history, and epidemiological information. The expected result is Negative. Fact Sheet for Patients: SugarRoll.be Fact Sheet for Healthcare Providers: https://www.woods-mathews.com/ This test is not yet approved or cleared by the [place] FDA and  has been authorized for detection and/or diagnosis of SARS-CoV-2 by FDA under an Emergency Use Authorization (EUA). This EUA will remain  in effect (meaning this test can be used) for the duration of the COVID-19 declaration under Section 56 4(b)(1) of the Act, 21 U.S.C. section 360bbb-3(b)(1), unless the authorization is terminated or revoked sooner. Performed at [facility], [place] [address], [place], [place] [address]      Labs: BNP (last 3 results) No results for input(s): BNP in the last 8760 hours. Basic Metabolic Panel: Recent Labs  Lab 03/15/19 1553  NA 130*  K 3.5  CL 95*  CO2 25  GLUCOSE 117*  BUN 7*  CREATININE 0.56  CALCIUM 8.9   Liver Function Tests: No results for input(s): AST, ALT, ALKPHOS, BILITOT, PROT, ALBUMIN in the last 168 hours. No results for input(s): LIPASE, AMYLASE in the last 168 hours. No results for input(s): AMMONIA in the last 168 hours. CBC: Recent Labs  Lab 03/15/19 1553  WBC 8.7  NEUTROABS 5.3  HGB 13.0  HCT 37.0  MCV 86.4  PLT 303   Cardiac Enzymes: No results for input(s): CKTOTAL, CKMB, CKMBINDEX, TROPONINI in the last 168 hours. BNP: Invalid input(s): POCBNP CBG: No results for input(s): GLUCAP in the last 168 hours. D-Dimer No results for input(s): DDIMER in the last 72 hours. Hgb A1c Recent Labs    03/16/19 0346  HGBA1C 5.9*   Lipid Profile Recent Labs    03/16/19 0346  CHOL 247*  HDL 64  LDLCALC 164*  TRIG 96  CHOLHDL 3.9   Thyroid function studies No results for input(s): TSH, T4TOTAL, T3FREE, THYROIDAB in the last 72 hours.  Invalid input(s):  FREET3 Anemia work up No results for input(s): VITAMINB12, FOLATE, FERRITIN, TIBC, IRON, RETICCTPCT in the last 72 hours. Urinalysis    Component Value Date/Time   COLORURINE YELLOW (A) 03/15/2019 2359   APPEARANCEUR CLEAR (A) 03/15/2019 2359   LABSPEC 1.006 03/15/2019 2359   PHURINE 7.0 03/15/2019 2359   GLUCOSEU NEGATIVE 03/15/2019 2359   HGBUR NEGATIVE 03/15/2019 2359   BILIRUBINUR NEGATIVE 03/15/2019 2359   BILIRUBINUR neg 07/08/2018 1109   KETONESUR NEGATIVE 03/15/2019 2359   PROTEINUR NEGATIVE 03/15/2019 2359   UROBILINOGEN 0.2 07/08/2018 1109   NITRITE NEGATIVE 03/15/2019 2359   LEUKOCYTESUR MODERATE (A) 03/15/2019 2359   Sepsis Labs Invalid input(s): PROCALCITONIN,  WBC,  LACTICIDVEN Microbiology Recent Results (from the past  240 hour(s))  SARS CORONAVIRUS 2 (TAT 6-24 HRS) Nasopharyngeal Nasopharyngeal Swab     Status: None   Collection Time: 03/15/19  5:18 PM   Specimen: Nasopharyngeal Swab  Result Value Ref Range Status   SARS Coronavirus 2 NEGATIVE NEGATIVE Final    Comment: (NOTE) SARS-CoV-2 target nucleic acids are NOT DETECTED. The SARS-CoV-2 RNA is generally detectable in upper and lower respiratory specimens during the acute phase of infection. Negative results do not preclude SARS-CoV-2 infection, do not rule out co-infections with other pathogens, and should not be used as the sole basis for treatment or other patient management decisions. Negative results must be combined with clinical observations, patient history, and epidemiological information. The expected result is Negative. Fact Sheet for Patients: SugarRoll.be Fact Sheet for Healthcare Providers: https://www.woods-mathews.com/ This test is not yet approved or cleared by the [place] FDA and  has been authorized for detection and/or diagnosis of SARS-CoV-2 by FDA under an Emergency Use Authorization (EUA). This EUA will remain  in effect (meaning this  test can be used) for the duration of the COVID-19 declaration under Section 56 4(b)(1) of the Act, 21 U.S.C. section 360bbb-3(b)(1), unless the authorization is terminated or revoked sooner. Performed at [facility], [place] [address], [place], [place] [address]      Time coordinating discharge: 25 minutes  SIGNED:   Louellen Molder, MD  [facility] 03/16/2019, 11:24 AM Pager   If 7PM-7AM, please contact night-coverage [email or web address] Password [email or web address]

## 2019-03-16 NOTE — Progress Notes (Signed)
Pt for discharge . Symptoms resolved.  A/o.  Sl out earlier.  Instructions discussed with pt. presc given  And they and home meds discussed.  Diet / activity and f/u discussed. Ready for discharge.

## 2019-03-21 ENCOUNTER — Ambulatory Visit (INDEPENDENT_AMBULATORY_CARE_PROVIDER_SITE_OTHER): Payer: Medicare HMO | Admitting: Internal Medicine

## 2019-03-21 ENCOUNTER — Other Ambulatory Visit: Payer: Self-pay

## 2019-03-21 ENCOUNTER — Encounter: Payer: Self-pay | Admitting: Internal Medicine

## 2019-03-21 VITALS — BP 158/92 | HR 85 | Ht 67.0 in | Wt 168.0 lb

## 2019-03-21 DIAGNOSIS — G459 Transient cerebral ischemic attack, unspecified: Secondary | ICD-10-CM | POA: Diagnosis not present

## 2019-03-21 DIAGNOSIS — E782 Mixed hyperlipidemia: Secondary | ICD-10-CM | POA: Diagnosis not present

## 2019-03-21 DIAGNOSIS — I10 Essential (primary) hypertension: Secondary | ICD-10-CM

## 2019-03-21 DIAGNOSIS — Z720 Tobacco use: Secondary | ICD-10-CM | POA: Diagnosis not present

## 2019-03-21 MED ORDER — DICLOFENAC SODIUM 50 MG PO TBEC: 50.0000 mg | DELAYED_RELEASE_TABLET | Freq: Three times a day (TID) | ORAL | 1 refills | Status: DC

## 2019-03-21 MED ORDER — AMLODIPINE BESYLATE 5 MG PO TABS: 5.0000 mg | ORAL_TABLET | Freq: Every day | ORAL | 5 refills | Status: DC

## 2019-03-21 MED ORDER — ROSUVASTATIN CALCIUM 5 MG PO TABS: 5.0000 mg | ORAL_TABLET | Freq: Every day | ORAL | 5 refills | Status: DC

## 2019-03-21 NOTE — Patient Instructions (Signed)
Continue to check your Blood pressure every other day or so.  Continue amlodipine 5 mg. Follow up in one month.  Start Crestor when you are ready.

## 2019-03-21 NOTE — Progress Notes (Signed)
Date:  03/21/2019   Name:  Chelsea Santana   DOB:  1950/04/09   MRN:  [id number]   Chief Complaint: Transient Ischemic Attack (TIA on 03/15/2019 and was admitted for 24 hours. ) and Hypertension (Feels medications are not working to keep pressure down. Uses wrist cuff at home and both arms are getting very different readings but still always high. ) Admitted to [facility] on 03/15/2019 and discharged on 03/16/2019. Hospital course: Transient ischemic attack No further symptoms or residual weakness.  Head CT unremarkable.  MRI of the brain without any acute ischemia or intracranial abnormality and shows mild chronic small vessel ischemic disease.  She does have tiny chronic lacunar infarct within the left caudate head.  Carotid ultrasound with <50% stenosis bilaterally.  2D echo with EF of 65 to 70% and no wall motion abnormality. Patient started on aspirin 81 mg daily. LDL of 164.  Patient was prescribed Crestor by her cardiologist but nonadherent.  Finally agrees to resume low-dose Crestor (5 mg daily).  Refuses to take higher dose stating that she had severe muscle cramping when she was on Crestor previously.   Counseled and educated on secondary stroke prevention. Added amlodipine 5 mg daily for blood pressure control and instructed to monitor blood pressure at home and keep a log of it and show it to her PCP during outpatient follow-up. HPI  Lab Results  Component Value Date   CREATININE 0.56 03/15/2019   BUN 7 (L) 03/15/2019   NA 130 (L) 03/15/2019   K 3.5 03/15/2019   CL 95 (L) 03/15/2019   CO2 25 03/15/2019   Lab Results  Component Value Date   CHOL 247 (H) 03/16/2019   HDL 64 03/16/2019   LDLCALC 164 (H) 03/16/2019   TRIG 96 03/16/2019   CHOLHDL 3.9 03/16/2019   Lab Results  Component Value Date   TSH 1.120 07/08/2018   Lab Results  Component Value Date   HGBA1C 5.9 (H) 03/16/2019     Review of Systems  Constitutional: Negative for chills, diaphoresis, fatigue and fever.   Respiratory: Negative for chest tightness, shortness of breath and wheezing.   Cardiovascular: Negative for chest pain, palpitations and leg swelling.  Gastrointestinal: Negative for constipation and nausea.  Neurological: Negative for dizziness, light-headedness and headaches.  Psychiatric/Behavioral: Negative for sleep disturbance.    Patient Active Problem List   Diagnosis Date Noted  . Tobacco abuse counseling 03/16/2019  . Tobacco abuse   . TIA (transient ischemic attack) 03/15/2019  . Hypertensive urgency 03/15/2019  . Centrilobular emphysema ([place]) 07/08/2018  . Thoracic aortic aneurysm without rupture ([place]) 07/08/2018  . Lumbar back pain with radiculopathy affecting right lower extremity 10/12/2017  . Multiple thyroid nodules 05/21/2016  . Plantar fasciitis 03/11/2016  . Coronary artery calcification seen on CT scan 05/20/2015  . Disease of spinal cord ([place]) 03/06/2015  . Degeneration of intervertebral disc of lumbar region 02/17/2015  . H/O abnormal cervical Papanicolaou smear 02/17/2015  . Mixed hyperlipidemia 02/17/2015  . Compulsive tobacco user syndrome 02/17/2015  . Vitamin D deficiency 02/17/2015  . Thymoma 07/17/2014  . Leg weakness 04/14/2013  . Cardiac murmur 08/31/2012  . Essential hypertension 08/31/2012  . CA of skin 08/31/2012    Allergies  Allergen Reactions  . Antihistamines, Chlorpheniramine-Type Other (See Comments)    Makes her feel high  . Oxymetazoline Other (See Comments)    Makes her "high"  . Prednisone     lethargy and anger  . Statins Other (See Comments)  Muscle Cramps   . Lipoic Acid Other (See Comments)    Numbness in hands    Past Surgical History:  Procedure Laterality Date  . CERVICAL CONE BIOPSY    . KNEE ARTHROSCOPY Bilateral   . THYMECTOMY  07/2014  . TONSILLECTOMY    . TUBAL LIGATION      Social History   Tobacco Use  . Smoking status: Current Every Day Smoker    Packs/day: 1.00    Years: 48.00    Pack years:  48.00    Types: Cigarettes  . Smokeless tobacco: Never Used  Substance Use Topics  . Alcohol use: No    Alcohol/week: 0.0 standard drinks  . Drug use: No     Medication list has been reviewed and updated.  Current Meds  Medication Sig  . albuterol (PROAIR HFA) 108 (90 Base) MCG/ACT inhaler Inhale 2 puffs into the lungs every 4 (four) hours as needed for wheezing or shortness of breath.  Marland Kitchen amLODipine (NORVASC) 5 MG tablet Take 1 tablet (5 mg total) by mouth daily.  Marland Kitchen aspirin EC 81 MG tablet Take 81 mg by mouth daily.  . baclofen (LIORESAL) 10 MG tablet Take 5 mg by mouth at bedtime.  . Cholecalciferol (VITAMIN D) 2000 UNITS CAPS Take 1 capsule by mouth daily.  . diclofenac (VOLTAREN) 50 MG EC tablet Take 1 tablet (50 mg total) by mouth 3 (three) times daily.  . Multiple Vitamins-Minerals (WOMENS 50+ MULTI VITAMIN/MIN) TABS Take by mouth.  . nicotine (NICOTROL) 10 MG inhaler Inhale 1 Cartridge (1 continuous puffing total) into the lungs as needed for smoking cessation.  . OMEGA-3 FATTY ACIDS PO Take by mouth.  . rosuvastatin (CRESTOR) 5 MG tablet Take 1 tablet (5 mg total) by mouth daily at 6 PM.  . [DISCONTINUED] diclofenac (VOLTAREN) 50 MG EC tablet Take 1 tablet (50 mg total) by mouth 3 (three) times daily. (Patient taking differently: Take 50 mg by mouth 3 (three) times daily with meals. )    PHQ 2/9 Scores 03/21/2019 09/01/2018 07/08/2018 05/15/2017  PHQ - 2 Score 3 0 0 0  PHQ- 9 Score 6 - - -    BP Readings from Last 3 Encounters:  03/21/19 (!) 158/92  03/16/19 (!) 155/75  09/01/18 136/82    Physical Exam Vitals and nursing note reviewed.  Constitutional:      General: She is not in acute distress.    Appearance: She is well-developed.  HENT:     Head: Normocephalic and atraumatic.  Cardiovascular:     Rate and Rhythm: Normal rate and regular rhythm.     Pulses: Normal pulses.     Heart sounds: No murmur.  Pulmonary:     Effort: Pulmonary effort is normal. No  respiratory distress.     Breath sounds: No wheezing or rhonchi.  Musculoskeletal:        General: Normal range of motion.     Cervical back: Normal range of motion.     Right lower leg: No edema.     Left lower leg: No edema.  Skin:    General: Skin is warm and dry.     Capillary Refill: Capillary refill takes less than 2 seconds.     Findings: No rash.  Neurological:     General: No focal deficit present.     Mental Status: She is alert and oriented to person, place, and time.  Psychiatric:        Behavior: Behavior normal.  Thought Content: Thought content normal.     Wt Readings from Last 3 Encounters:  03/21/19 168 lb (76.2 kg)  03/15/19 220 lb (99.8 kg)  09/01/18 165 lb (74.8 kg)    BP (!) 158/92 (BP Location: Left Arm, Patient Position: Sitting, Cuff Size: Normal)   Pulse 85   Ht 5\' 7"  (1.702 m)   Wt 168 lb (76.2 kg)   SpO2 96%   BMI 26.31 kg/m   Assessment and Plan: 1. TIA (transient ischemic attack) Left sided weakness completely resolved with no recurrence Continue Aspirin 81 mg daily Continue to work on smoking cessation  2. Essential hypertension Improved blood pressure after only 5 days of amlodipine - will need to continue same dose and recheck again in one month. - amLODipine (NORVASC) 5 MG tablet; Take 1 tablet (5 mg total) by mouth daily.  Dispense: 30 tablet; Refill: 5  3. Mixed hyperlipidemia She will start the crestor in the near future once she is sure that she tolerates the amlodipine - rosuvastatin (CRESTOR) 5 MG tablet; Take 1 tablet (5 mg total) by mouth daily at 6 PM.  Dispense: 30 tablet; Refill: 5  4. Tobacco abuse Pt encouraged to continue to cut back on smoking - has already cut back to 14 per day from 28 per day. She is using the nicotine patch with some success   Partially dictated using Editor, commissioning. Any errors are unintentional.  Halina Maidens, MD [facility]  03/21/2019

## 2019-03-22 ENCOUNTER — Ambulatory Visit: Payer: Medicare HMO | Admitting: Internal Medicine

## 2019-03-23 LAB — ACETYLCHOLINE RECEPTOR AB, ALL
Acety choline binding ab: <0.03 nmol/L (ref 0.00–0.24)
Acetylchol Block Ab: 17 % (ref 0–25)
Acetylcholine Modulat Ab: <12 % (ref 0–20)

## 2019-03-24 ENCOUNTER — Other Ambulatory Visit: Payer: Self-pay

## 2019-03-24 DIAGNOSIS — I10 Essential (primary) hypertension: Secondary | ICD-10-CM

## 2019-03-24 MED ORDER — AMLODIPINE BESYLATE 10 MG PO TABS: 10.0000 mg | ORAL_TABLET | Freq: Every day | ORAL | 5 refills | Status: DC

## 2019-04-11 ENCOUNTER — Other Ambulatory Visit: Payer: Self-pay | Admitting: Internal Medicine

## 2019-04-11 ENCOUNTER — Telehealth: Payer: Self-pay

## 2019-04-11 DIAGNOSIS — I10 Essential (primary) hypertension: Secondary | ICD-10-CM

## 2019-04-11 MED ORDER — LOSARTAN POTASSIUM 25 MG PO TABS: 25.0000 mg | ORAL_TABLET | Freq: Every day | ORAL | 2 refills | Status: DC

## 2019-04-11 NOTE — Telephone Encounter (Signed)
Patient said she only takes the medication every other night ( 5 mg )   After taking it at night she wakes up the next day with a stump feeling right foot, severe back pain in lower back and shoulder blades, and left side weakness. She said she does not feel she is having stroke and having no slurred speech or chest pain. She would like to try a different medication. Will follow up at her appt on 01/19 unless she has more worrisome symptoms. Will stop amlodipine and start new med that is sent in.

## 2019-04-11 NOTE — Telephone Encounter (Signed)
Pt called saying she is now taking 5mg  every other day of amlodipine because she started to having weakness of her left arm ( "feels like a rag doll" )  Said her pains have intensified in her body since starting amlodipine and this is not helping her BP. She also feels dizzy. Wants something different to take for BP medication because this is not helping. Said she has not started the cholesterol medication because she wants to get her BP meds under control first.   Please advise.

## 2019-04-11 NOTE — Telephone Encounter (Signed)
It sounds like she is having the TIA symptoms that took her to the hospital.  If the arm weakness is persistent, then that would be a stroke.  It is not coming from her BP medication.

## 2019-04-18 ENCOUNTER — Other Ambulatory Visit: Payer: Self-pay | Admitting: Internal Medicine

## 2019-04-18 ENCOUNTER — Telehealth: Payer: Self-pay

## 2019-04-18 DIAGNOSIS — I10 Essential (primary) hypertension: Secondary | ICD-10-CM

## 2019-04-18 MED ORDER — LISINOPRIL 5 MG PO TABS: 5.0000 mg | ORAL_TABLET | Freq: Every day | ORAL | 0 refills | Status: DC

## 2019-04-18 NOTE — Telephone Encounter (Signed)
Left Vm informing patient of this information. Told her to call me back if she has any further questions, but to take the losartan medication as directed.

## 2019-04-18 NOTE — Telephone Encounter (Signed)
The combination may cause the Losartan to be less effective.  There is a small chance of increase in potassium levels which is why we check blood work throughout the year.  She still needs to take Losartan.

## 2019-04-18 NOTE — Telephone Encounter (Signed)
Patient called back saying she took 2 doses of losartan. 1 last Thursday and Friday. She said she felt extremely lightheaded. BP was all over the place and her lower back and lower right leg had pains. She said she cannot take this medications because the muscle cramps and dizziness its causing. She wants to know is there another option?  Please advise.

## 2019-04-18 NOTE — Telephone Encounter (Signed)
Patient said she did some research on her new medicine losartan and it says she has an increased risk of side effects when taking losartan and diclofenac together.  Please advise?

## 2019-04-18 NOTE — Telephone Encounter (Signed)
Stop Losartan.   Take lisinopril 5 mg daily - sent to pharmacy.

## 2019-04-19 NOTE — Telephone Encounter (Signed)
Patient informed and will try lisinopril.

## 2019-04-26 ENCOUNTER — Ambulatory Visit: Payer: Medicare HMO | Admitting: Internal Medicine

## 2019-05-09 ENCOUNTER — Other Ambulatory Visit: Payer: Self-pay | Admitting: Internal Medicine

## 2019-05-09 ENCOUNTER — Telehealth: Payer: Self-pay

## 2019-05-09 DIAGNOSIS — I10 Essential (primary) hypertension: Secondary | ICD-10-CM

## 2019-05-09 MED ORDER — METOPROLOL SUCCINATE ER 50 MG PO TB24: 50.0000 mg | ORAL_TABLET | Freq: Every day | ORAL | 0 refills | Status: DC

## 2019-05-09 NOTE — Telephone Encounter (Signed)
Patient informed. Said she will try the medication next week. She is going to take a week break from "chemicals."

## 2019-05-09 NOTE — Telephone Encounter (Signed)
Stop lisinopril. I sent in metoprolol 50 mg to take once a day.

## 2019-05-09 NOTE — Telephone Encounter (Signed)
Patient called saying she took Lisinopril medication for 12 days straight. Said for the last 4 days her BP has been going up 10 points per day on her systolic numbers. She said she has been dizzy and lightheaded. Stopped the medication 2 days ago and does not wish to continue this.  Her BP is staying elevated at around 168/90's. Wants to try something different to lower her BP.  Please advise?

## 2019-05-14 ENCOUNTER — Other Ambulatory Visit: Payer: Self-pay | Admitting: Internal Medicine

## 2019-05-14 DIAGNOSIS — I10 Essential (primary) hypertension: Secondary | ICD-10-CM

## 2019-05-20 ENCOUNTER — Ambulatory Visit: Payer: Medicare HMO | Admitting: Internal Medicine

## 2019-05-24 ENCOUNTER — Ambulatory Visit: Payer: Medicare HMO | Admitting: Internal Medicine

## 2019-06-02 ENCOUNTER — Other Ambulatory Visit: Payer: Self-pay | Admitting: Internal Medicine

## 2019-06-02 DIAGNOSIS — I10 Essential (primary) hypertension: Secondary | ICD-10-CM

## 2019-06-05 IMAGING — US US THYROID
1 series · 13 of 25 positions shown · non-contrast
Comparison: 06/02/2017

CLINICAL DATA: Thyroid nodule by CT

EXAM:
THYROID ULTRASOUND
TECHNIQUE: Ultrasound examination of the thyroid gland and adjacent soft
tissues was performed.

[Series 1: us thyroid · 0.07mm/px · 13 of 63 slices shown]
[im 1/63]
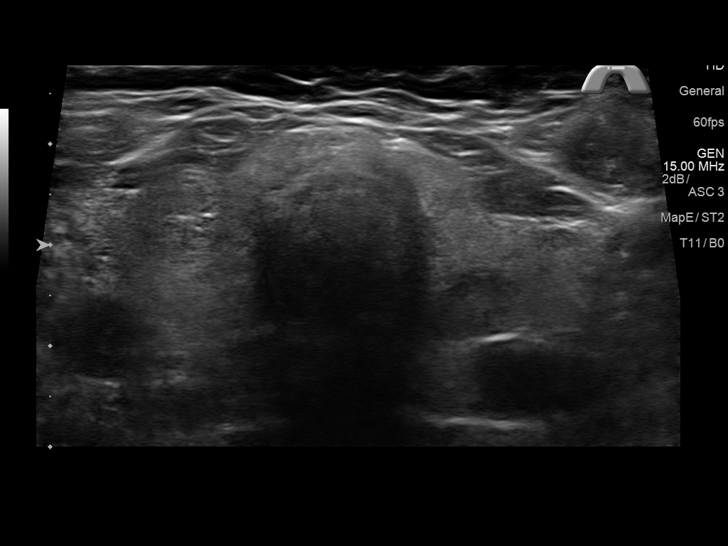
[im 6/63]
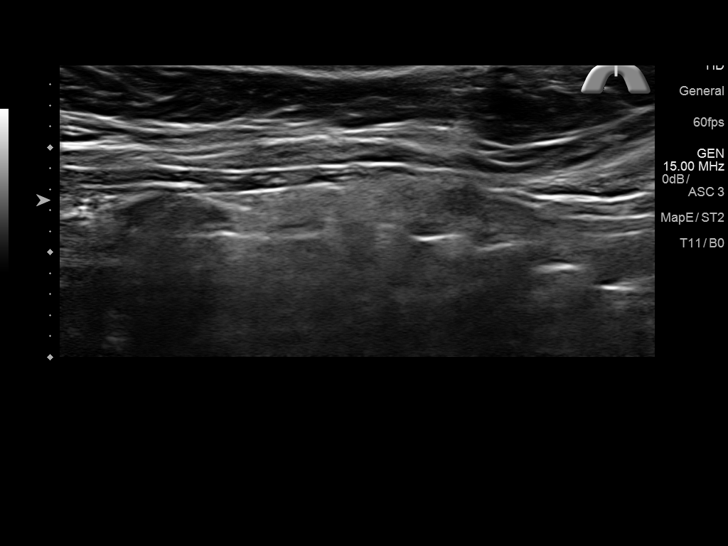
[im 11/63]
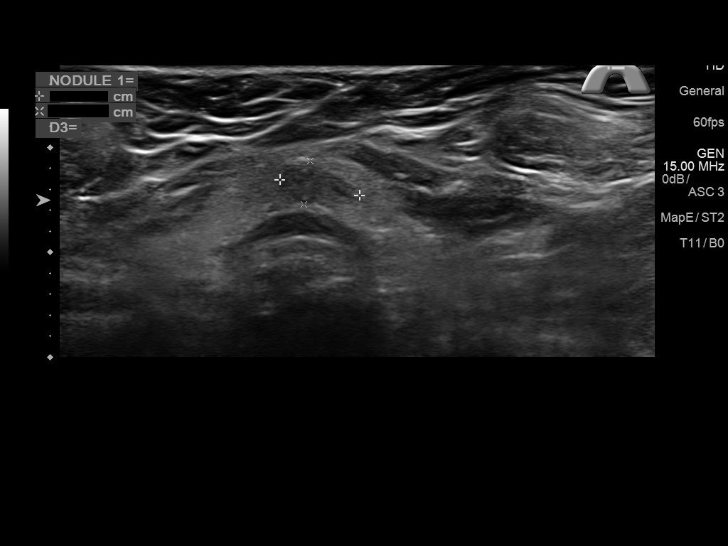
[im 16/63]
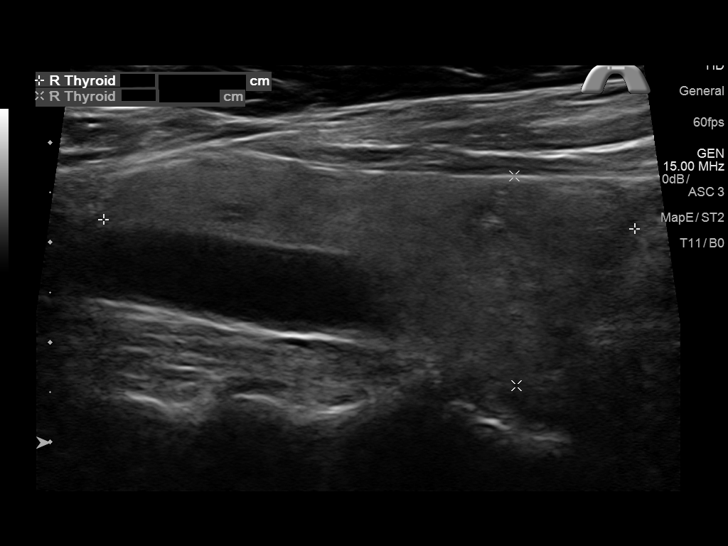
[im 21/63]
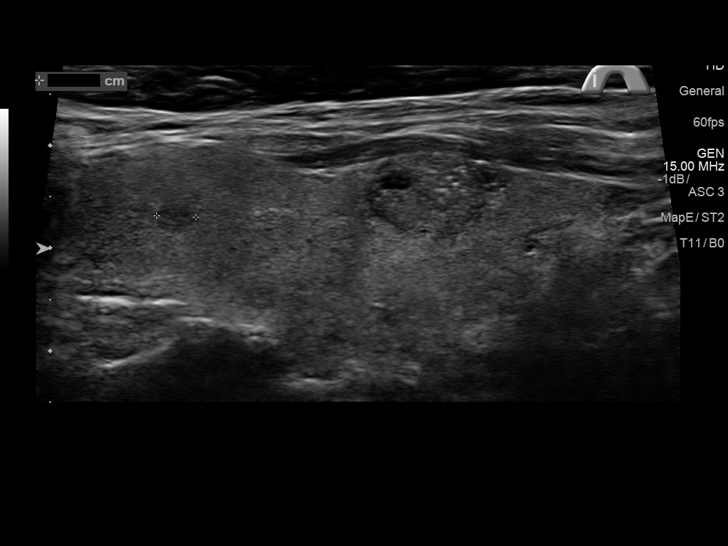
[im 26/63]
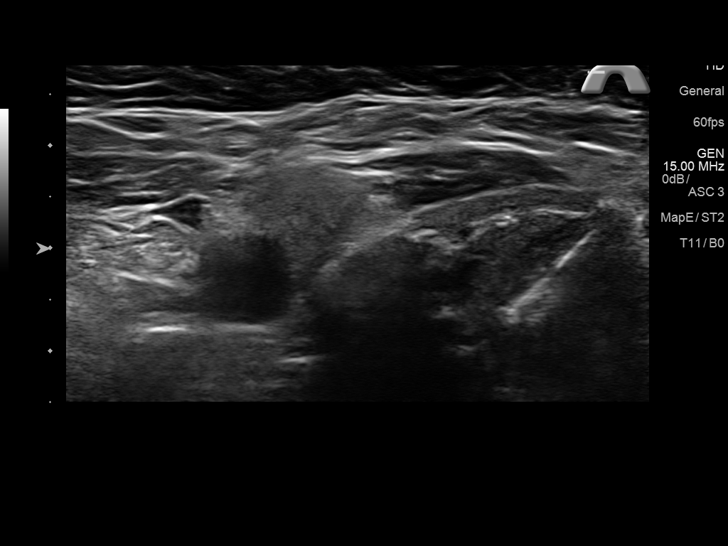
[im 32/63]
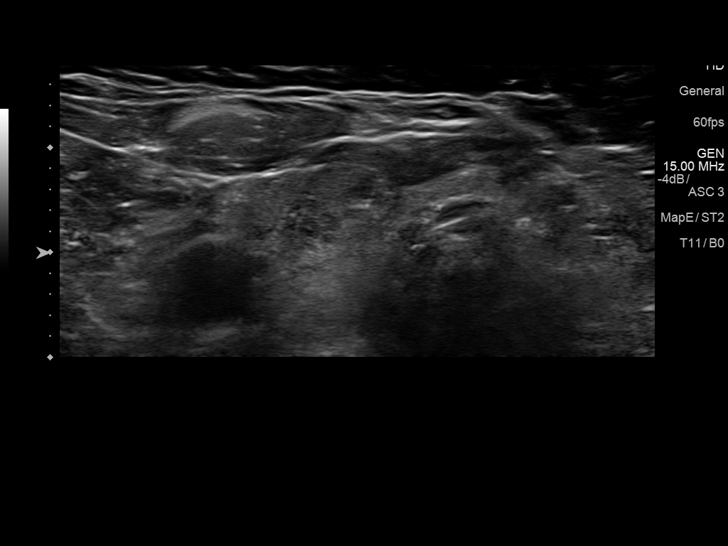
[im 37/63]
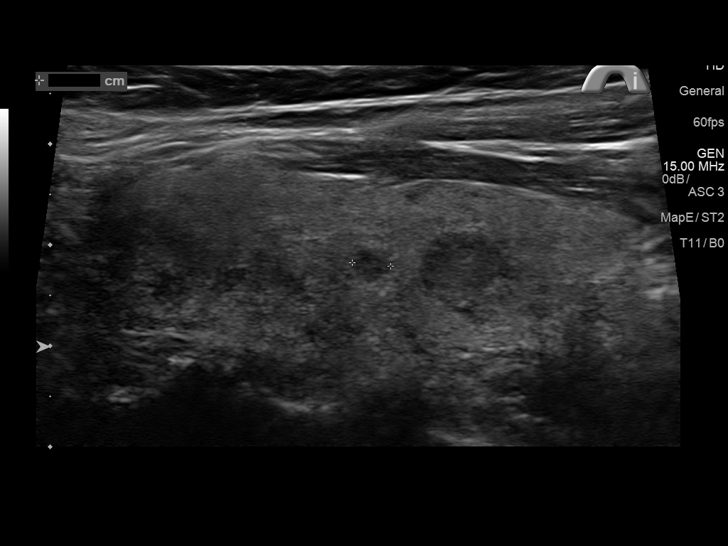
[im 42/63]
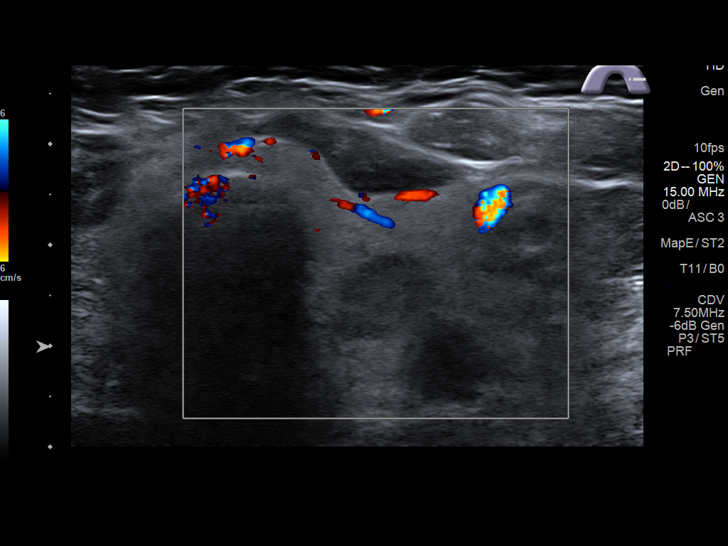
[im 47/63]
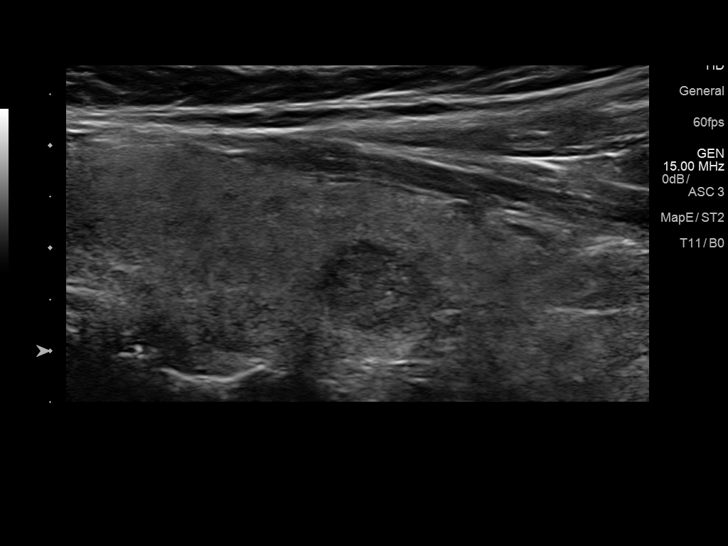
[im 52/63]
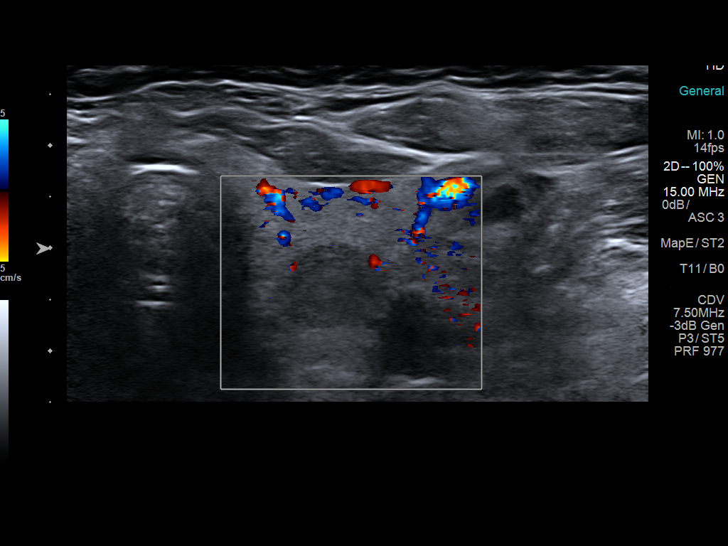
[im 57/63]
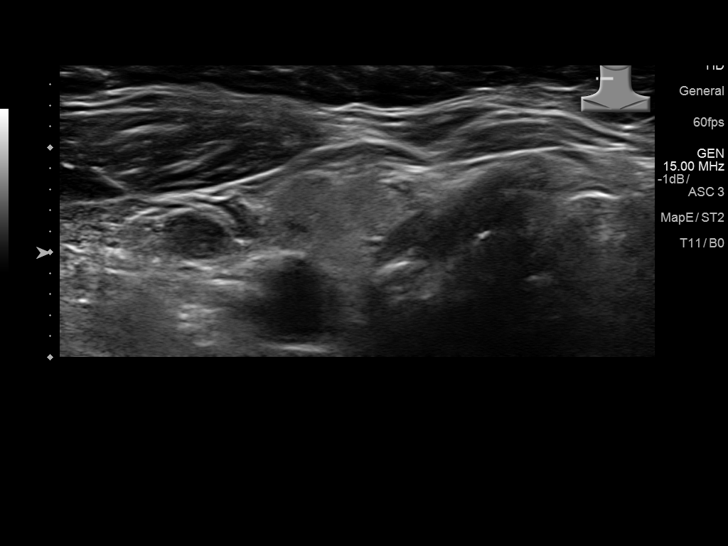
[im 63/63]
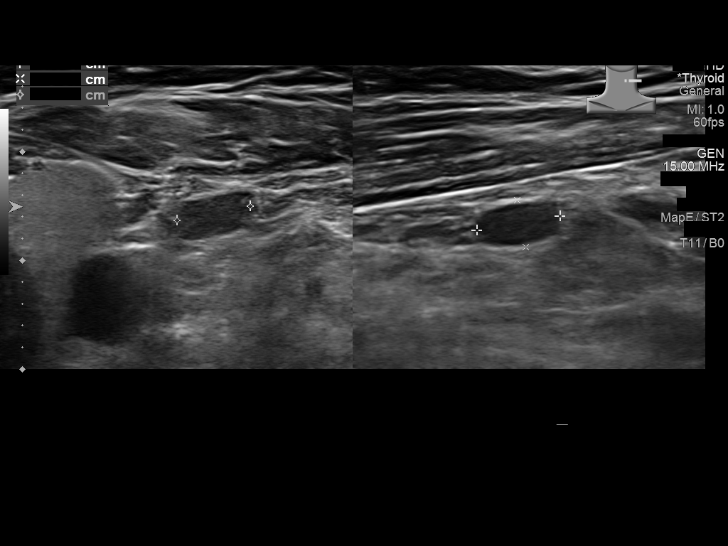

[13 of 25 positions shown; findings below may reference images not displayed]

FINDINGS: Parenchymal Echotexture: Normal

Isthmus: 4 mm

Right lobe: 5.3 x 2.1 x 1.7 cm

Left lobe: 5.0 x 1.8 x 1.8 cm

_________________________________________________________

Estimated total number of nodules >/= 1 cm: 2

Number of spongiform nodules >/=  2 cm not described below (TR1): 0

Number of mixed cystic and solid nodules >/= 1.5 cm not described
below (TR2): 0

_________________________________________________________

Nodule # 2:

Location: Right; Inferior

Maximum size: 1.4 cm; Other 2 dimensions: 1.1 x 0.8 cm

Composition: solid/almost completely solid (2)

Echogenicity: isoechoic (1)

Shape: not taller-than-wide (0)

Margins: ill-defined (0)

Echogenic foci: punctate echogenic foci (3)

ACR TI-RADS total points: 6.

ACR TI-RADS risk category: TR4 (4-6 points).

ACR TI-RADS recommendations:

*Given size (>/= 1 - 1.4 cm) and appearance, a follow-up ultrasound
in 1 year should be considered based on TI-RADS criteria.

_________________________________________________________

Nodule # 3:

Location: Left; Inferior

Maximum size: 1.2 cm; Other 2 dimensions: 0.9 x 0.8 cm

Composition: solid/almost completely solid (2)

Echogenicity: hypoechoic (2)

Shape: not taller-than-wide (0)

Margins: ill-defined (0)

Echogenic foci: none (0)

ACR TI-RADS total points: 4.

ACR TI-RADS risk category: TR4 (4-6 points).

ACR TI-RADS recommendations:

*Given size (>/= 1 - 1.4 cm) and appearance, a follow-up ultrasound
in 1 year should be considered based on TI-RADS criteria.

_________________________________________________________

Additional isthmic hypoechoic nodule only measures 8 mm. This would
not meet criteria for any biopsy or follow-up.

No adenopathy.
IMPRESSION: Dominant TR 4 nodules noted bilaterally which meet criteria for
follow-up in 1 year. Currently no biopsy indicated.

The above is in keeping with the ACR TI-RADS recommendations - [HOSPITAL] 5977;[DATE].

## 2019-07-11 ENCOUNTER — Ambulatory Visit (INDEPENDENT_AMBULATORY_CARE_PROVIDER_SITE_OTHER): Payer: Medicare HMO | Admitting: Internal Medicine

## 2019-07-11 ENCOUNTER — Encounter: Payer: Self-pay | Admitting: Internal Medicine

## 2019-07-11 ENCOUNTER — Other Ambulatory Visit: Payer: Self-pay

## 2019-07-11 VITALS — BP 150/95 | HR 95 | Temp 98.1°F | Ht 67.0 in | Wt 170.0 lb

## 2019-07-11 DIAGNOSIS — I712 Thoracic aortic aneurysm, without rupture, unspecified: Secondary | ICD-10-CM

## 2019-07-11 DIAGNOSIS — Z720 Tobacco use: Secondary | ICD-10-CM

## 2019-07-11 DIAGNOSIS — Z1211 Encounter for screening for malignant neoplasm of colon: Secondary | ICD-10-CM | POA: Diagnosis not present

## 2019-07-11 DIAGNOSIS — J432 Centrilobular emphysema: Secondary | ICD-10-CM

## 2019-07-11 DIAGNOSIS — E782 Mixed hyperlipidemia: Secondary | ICD-10-CM | POA: Diagnosis not present

## 2019-07-11 DIAGNOSIS — E042 Nontoxic multinodular goiter: Secondary | ICD-10-CM | POA: Diagnosis not present

## 2019-07-11 DIAGNOSIS — I10 Essential (primary) hypertension: Secondary | ICD-10-CM

## 2019-07-11 DIAGNOSIS — R7989 Other specified abnormal findings of blood chemistry: Secondary | ICD-10-CM | POA: Diagnosis not present

## 2019-07-11 DIAGNOSIS — Z1231 Encounter for screening mammogram for malignant neoplasm of breast: Secondary | ICD-10-CM

## 2019-07-11 DIAGNOSIS — Z0000 Encounter for general adult medical examination without abnormal findings: Secondary | ICD-10-CM

## 2019-07-11 DIAGNOSIS — G959 Disease of spinal cord, unspecified: Secondary | ICD-10-CM

## 2019-07-11 DIAGNOSIS — E559 Vitamin D deficiency, unspecified: Secondary | ICD-10-CM

## 2019-07-11 LAB — POCT URINALYSIS DIPSTICK
Bilirubin, UA: NEGATIVE
Blood, UA: NEGATIVE
Glucose, UA: NEGATIVE
Ketones, UA: NEGATIVE
Nitrite, UA: NEGATIVE
Protein, UA: NEGATIVE
Spec Grav, UA: 1.01 (ref 1.010–1.025)
Urobilinogen, UA: 0.2 E.U./dL
pH, UA: 7 (ref 5.0–8.0)

## 2019-07-11 NOTE — Progress Notes (Signed)
Date:  07/11/2019   Name:  Chelsea Santana   DOB:  10-07-1950   MRN:  [id number]   Chief Complaint: Annual Exam (Breast Exam. No pap- aged out. ) Chelsea Santana is a 69 y.o. female who presents today for her Complete Annual Exam. She feels fairly well. She reports exercising very rarely. She reports she is sleeping fairly well. She denies breast issues.  She is not taking any prescription medications.  Mammogram  05/2013 - does not want unless there is a problem DEXA never Colonoscopy - never (FIT given last year but never completed) She is willing to do cologuard. Immunization History  Administered Date(s) Administered  . Tdap 05/21/2015    Hypertension This is a chronic problem. The problem is uncontrolled. Associated symptoms include shortness of breath. Pertinent negatives include no chest pain, headaches or palpitations. Treatments tried: did not tolerate amlodipine, losartan, lisinopril or metoprolol  Compliance problems include medication side effects (she prefers not to take any medications).  Hypertensive end-organ damage includes CVA. TIA 03/2019/ Coronary artery calcifications/ 50% bilateral CAS/ aortic aneurysm. Identifiable causes of hypertension include a thyroid problem.  Hyperlipidemia This is a chronic problem. The problem is uncontrolled. Associated symptoms include shortness of breath. Pertinent negatives include no chest pain. Treatments tried: was supposed to start Crestor but she has decided not to take medication.  Thyroid Problem Presents for follow-up visit. Patient reports no anxiety, constipation, depressed mood, diaphoresis, diarrhea, fatigue, hair loss, palpitations, tremors or weight gain. (Nodules being followed by [place] - annual due now) The symptoms have been stable. Her past medical history is significant for hyperlipidemia.  COPD - continues to smoke but not using any controlled medications.  She quit using albuterol regularly after her TIA - she thinks the two may  be related.  She does have albuterol to use if she really needs it.  Otherwise, she modifies her activities and uses [place] vaporub. CVD - she has know aortic aneurysm, CAD < 50% bilaterally and evidence of coronary artery calcifications.  She is taking an 81 mg aspirin daily and fish oil.  She is not willing to take statin therapy.  Lab Results  Component Value Date   CREATININE 0.56 03/15/2019   BUN 7 (L) 03/15/2019   NA 130 (L) 03/15/2019   K 3.5 03/15/2019   CL 95 (L) 03/15/2019   CO2 25 03/15/2019   Lab Results  Component Value Date   CHOL 247 (H) 03/16/2019   HDL 64 03/16/2019   LDLCALC 164 (H) 03/16/2019   TRIG 96 03/16/2019   CHOLHDL 3.9 03/16/2019   Lab Results  Component Value Date   TSH 1.120 07/08/2018   Lab Results  Component Value Date   HGBA1C 5.9 (H) 03/16/2019   Lab Results  Component Value Date   WBC 8.7 03/15/2019   HGB 13.0 03/15/2019   HCT 37.0 03/15/2019   MCV 86.4 03/15/2019   PLT 303 03/15/2019   Lab Results  Component Value Date   ALT 15 07/08/2018   AST 15 07/08/2018   ALKPHOS 64 07/08/2018   BILITOT 0.3 07/08/2018     Review of Systems  Constitutional: Negative for chills, diaphoresis, fatigue, fever and weight gain.  HENT: Negative for congestion, hearing loss, trouble swallowing and voice change.   Eyes: Negative for visual disturbance.  Respiratory: Positive for cough and shortness of breath. Negative for chest tightness and wheezing.   Cardiovascular: Negative for chest pain, palpitations and leg swelling.  Gastrointestinal: Negative for abdominal pain,  constipation, diarrhea and vomiting.  Endocrine: Negative for polydipsia and polyuria.  Genitourinary: Negative for dysuria, frequency and urgency.  Musculoskeletal: Positive for arthralgias and back pain. Negative for gait problem and joint swelling.  Skin: Negative for color change and rash.  Neurological: Negative for dizziness, tremors, light-headedness and headaches.   Hematological: Negative for adenopathy. Does not bruise/bleed easily.  Psychiatric/Behavioral: Negative for dysphoric mood and sleep disturbance. The patient is not nervous/anxious.     Patient Active Problem List   Diagnosis Date Noted  . TIA (transient ischemic attack) 03/15/2019  . Centrilobular emphysema ([place]) 07/08/2018  . Thoracic aortic aneurysm without rupture ([place]) 07/08/2018  . Lumbar back pain with radiculopathy affecting right lower extremity 10/12/2017  . Multiple thyroid nodules 05/21/2016  . Plantar fasciitis 03/11/2016  . Coronary artery calcification seen on CT scan 05/20/2015  . Spinal cord lesion ([place]) 03/06/2015  . Degeneration of intervertebral disc of lumbar region 02/17/2015  . H/O abnormal cervical Papanicolaou smear 02/17/2015  . Mixed hyperlipidemia 02/17/2015  . Compulsive tobacco user syndrome 02/17/2015  . Vitamin D deficiency 02/17/2015  . Thymoma 07/17/2014  . Leg weakness 04/14/2013  . Cardiac murmur 08/31/2012  . Essential hypertension 08/31/2012  . CA of skin 08/31/2012    Allergies  Allergen Reactions  . Antihistamines, Chlorpheniramine-Type Other (See Comments)    Makes her feel high  . Oxymetazoline Other (See Comments)    Makes her "high"  . Prednisone     lethargy and anger  . Statins Other (See Comments)    Muscle Cramps   . Lipoic Acid Other (See Comments)    Numbness in hands    Past Surgical History:  Procedure Laterality Date  . CERVICAL CONE BIOPSY    . KNEE ARTHROSCOPY Bilateral   . THYMECTOMY  07/2014  . TONSILLECTOMY    . TUBAL LIGATION      Social History   Tobacco Use  . Smoking status: Current Every Day Smoker    Packs/day: 0.75    Years: 48.00    Pack years: 36.00    Types: Cigarettes  . Smokeless tobacco: Never Used  Substance Use Topics  . Alcohol use: No    Alcohol/week: 0.0 standard drinks  . Drug use: No     Medication list has been reviewed and updated.  Current Meds  Medication Sig  .  aspirin EC 81 MG tablet Take 81 mg by mouth daily.  . baclofen (LIORESAL) 10 MG tablet Take 5 mg by mouth at bedtime.  . Cholecalciferol (VITAMIN D) 2000 UNITS CAPS Take 1 capsule by mouth daily.  . diclofenac (VOLTAREN) 50 MG EC tablet Take 1 tablet (50 mg total) by mouth 3 (three) times daily.  . Multiple Vitamins-Minerals (WOMENS 50+ MULTI VITAMIN/MIN) TABS Take by mouth.  . Omega-3 Fatty Acids (FISH OIL) 1000 MG CAPS Take by mouth.  . OMEGA-3 FATTY ACIDS PO Take by mouth.    PHQ 2/9 Scores 07/11/2019 03/21/2019 09/01/2018 07/08/2018  PHQ - 2 Score 0 3 0 0  PHQ- 9 Score 0 6 - -    BP Readings from Last 3 Encounters:  07/11/19 (!) 150/95  03/21/19 (!) 158/92  03/16/19 (!) 155/75    Physical Exam Vitals and nursing note reviewed.  Constitutional:      General: She is not in acute distress.    Appearance: She is well-developed.  HENT:     Head: Normocephalic and atraumatic.     Right Ear: Tympanic membrane and ear canal normal.  Left Ear: Tympanic membrane and ear canal normal.     Nose:     Right Sinus: No maxillary sinus tenderness.     Left Sinus: No maxillary sinus tenderness.  Eyes:     General: No scleral icterus.       Right eye: No discharge.        Left eye: No discharge.     Conjunctiva/sclera: Conjunctivae normal.  Neck:     Thyroid: No thyromegaly.     Vascular: No carotid bruit.  Cardiovascular:     Rate and Rhythm: Normal rate and regular rhythm.     Pulses: Normal pulses.     Heart sounds: Normal heart sounds.  Pulmonary:     Effort: Pulmonary effort is normal. No respiratory distress.     Breath sounds: No wheezing.  Chest:     Breasts:        Right: No mass, nipple discharge, skin change or tenderness.        Left: No mass, nipple discharge, skin change or tenderness.  Abdominal:     General: Bowel sounds are normal.     Palpations: Abdomen is soft.     Tenderness: There is no abdominal tenderness. There is no guarding or rebound.   Musculoskeletal:     Cervical back: Normal range of motion. No erythema.     Lumbar back: Tenderness present. Decreased range of motion.     Right lower leg: No edema.     Left lower leg: No edema.  Lymphadenopathy:     Cervical: No cervical adenopathy.  Skin:    General: Skin is warm and dry.     Capillary Refill: Capillary refill takes less than 2 seconds.     Findings: No rash.  Neurological:     General: No focal deficit present.     Mental Status: She is alert and oriented to person, place, and time.     Cranial Nerves: No cranial nerve deficit.     Sensory: No sensory deficit.     Motor: Motor function is intact.     Deep Tendon Reflexes: Reflexes are normal and symmetric.     Reflex Scores:      Bicep reflexes are 2+ on the right side and 2+ on the left side.      Patellar reflexes are 2+ on the right side and 2+ on the left side. Psychiatric:        Attention and Perception: Attention normal.        Mood and Affect: Mood normal.        Speech: Speech normal.        Behavior: Behavior normal.        Thought Content: Thought content normal.     Wt Readings from Last 3 Encounters:  07/11/19 170 lb (77.1 kg)  03/21/19 168 lb (76.2 kg)  03/15/19 220 lb (99.8 kg)    BP (!) 150/95   Pulse 95   Temp 98.1 F (36.7 C) (Oral)   Ht 5\' 7"  (1.702 m)   Wt 170 lb (77.1 kg)   SpO2 95%   BMI 26.63 kg/m   Assessment and Plan: 1. Annual physical exam Normal exam except for BP She is encouraged to continue healthy diet, more regular exercise - POCT urinalysis dipstick - not a clean catch with multiple epithelial cells  2. Encounter for screening mammogram for breast cancer Pt declines screening mammograms Breast exam today is without mass or tenderness  3. Colon cancer screening Patient  agrees to Cologuard testing - Cologuard  4. Multiple thyroid nodules Patient remains asymptomatic Due for annual [place] follow up - TSH + free T4 - US THYROID; Future  5.  Centrilobular emphysema ([place]) She has albuterol to use PRN Does not use any inhaler regularly since she had her TIA - she believes that there is a causal relationship  6. Thoracic aortic aneurysm without rupture ([place]) Being monitored regularly by [facility] per patient but I can not find a recent note She declines medications for HTN or lipids Last CT 08/2018: IMPRESSION: 1. No change in caliber of the tubular ascending thoracic aorta, measuring 3.9 x 3.9 cm. Moderate mixed aortic atherosclerosis.  7. Essential hypertension BP is not controlled in the office setting She states reading are 120/80 at home at rest - CBC with Differential/Platelet - Comprehensive metabolic panel  8. Mixed hyperlipidemia Patient is not willing to try statin medication at this time - Lipid panel  9. Tobacco abuse continues  10. Vitamin D deficiency On daily supplement Suggested DEXA but she would not take bisphosphates in any case so will not order at this time - VITAMIN D 25 Hydroxy (Vit-D Deficiency, Fractures)  11. Disease of spinal cord ([place]) MRI 09/2018: IMPRESSION:  1. Similar appearance of the T2 hyperintense lesion along the right dorsal lateral aspect of the thoracic cord at the T5-T6 level. No obvious abnormal enhancement. 2. Mild level degenerative disc changes in the thoracic spine without evidence of significant central canal stenosis. 3. Borderline enlargement of the descending thoracic aorta which measures 2.9 cm.   Partially dictated using Editor, commissioning. Any errors are unintentional.  Halina Maidens, MD [facility]  07/11/2019

## 2019-07-12 ENCOUNTER — Encounter: Payer: Self-pay | Admitting: Internal Medicine

## 2019-07-12 LAB — CBC WITH DIFFERENTIAL/PLATELET
Basophils Absolute: 0 10*3/uL (ref 0.0–0.2)
Basos: 0 %
EOS (ABSOLUTE): 0.2 10*3/uL (ref 0.0–0.4)
Eos: 2 %
Hematocrit: 41.1 % (ref 34.0–46.6)
Hemoglobin: 13.7 g/dL (ref 11.1–15.9)
Immature Grans (Abs): 0 10*3/uL (ref 0.0–0.1)
Immature Granulocytes: 0 %
Lymphocytes Absolute: 2.4 10*3/uL (ref 0.7–3.1)
Lymphs: 24 %
MCH: 30.4 pg (ref 26.6–33.0)
MCHC: 33.3 g/dL (ref 31.5–35.7)
MCV: 91 fL (ref 79–97)
Monocytes Absolute: 0.7 10*3/uL (ref 0.1–0.9)
Monocytes: 7 %
Neutrophils Absolute: 6.4 10*3/uL (ref 1.4–7.0)
Neutrophils: 67 %
Platelets: 408 10*3/uL (ref 150–450)
RBC: 4.51 x10E6/uL (ref 3.77–5.28)
RDW: 12.6 % (ref 11.7–15.4)
WBC: 9.7 10*3/uL (ref 3.4–10.8)

## 2019-07-12 LAB — TSH+FREE T4
Free T4: 1.62 ng/dL (ref 0.82–1.77)
TSH: 1.09 u[IU]/mL (ref 0.450–4.500)

## 2019-07-12 LAB — COMPREHENSIVE METABOLIC PANEL
ALT: 18 IU/L (ref 0–32)
AST: 20 IU/L (ref 0–40)
Albumin/Globulin Ratio: 1.6 (ref 1.2–2.2)
Albumin: 4.4 g/dL (ref 3.8–4.8)
Alkaline Phosphatase: 62 IU/L (ref 39–117)
BUN/Creatinine Ratio: 10 — ABNORMAL LOW (ref 12–28)
BUN: 9 mg/dL (ref 8–27)
Bilirubin Total: 0.3 mg/dL (ref 0.0–1.2)
CO2: 23 mmol/L (ref 20–29)
Calcium: 9.8 mg/dL (ref 8.7–10.3)
Chloride: 96 mmol/L (ref 96–106)
Creatinine, Ser: 0.86 mg/dL (ref 0.57–1.00)
GFR calc Af Amer: 81 mL/min/{1.73_m2} (ref >59)
GFR calc non Af Amer: 71 mL/min/{1.73_m2} (ref >59)
Globulin, Total: 2.8 g/dL (ref 1.5–4.5)
Glucose: 110 mg/dL — ABNORMAL HIGH (ref 65–99)
Potassium: 5.2 mmol/L (ref 3.5–5.2)
Sodium: 133 mmol/L — ABNORMAL LOW (ref 134–144)
Total Protein: 7.2 g/dL (ref 6.0–8.5)

## 2019-07-12 LAB — LIPID PANEL
Chol/HDL Ratio: 3.7 ratio (ref 0.0–4.4)
Cholesterol, Total: 242 mg/dL — ABNORMAL HIGH (ref 100–199)
HDL: 65 mg/dL (ref >39)
LDL Chol Calc (NIH): 155 mg/dL — ABNORMAL HIGH (ref 0–99)
Triglycerides: 127 mg/dL (ref 0–149)
VLDL Cholesterol Cal: 22 mg/dL (ref 5–40)

## 2019-07-12 LAB — VITAMIN D 25 HYDROXY (VIT D DEFICIENCY, FRACTURES): Vit D, 25-Hydroxy: 44.2 ng/mL (ref 30.0–100.0)

## 2019-07-15 ENCOUNTER — Ambulatory Visit: Payer: Medicare HMO

## 2019-07-16 LAB — SPECIMEN STATUS REPORT

## 2019-07-16 LAB — HGB A1C W/O EAG: Hgb A1c MFr Bld: 5.7 % — ABNORMAL HIGH (ref 4.8–5.6)

## 2019-07-18 ENCOUNTER — Telehealth: Payer: Self-pay | Admitting: *Deleted

## 2019-07-18 MED ORDER — EZETIMIBE 10 MG PO TABS: 10.0000 mg | ORAL_TABLET | Freq: Every day | ORAL | 3 refills | Status: AC

## 2019-07-18 NOTE — Telephone Encounter (Signed)
-----   Message from [name], MD sent at 07/17/2019 11:03 AM EDT ----- Cholesterol is markedly elevated And given her atherosclerotic disease with her smoking she is high risk of heart attack and stroke -On last clinic visit we recommended titrating up on the Crestor? I do not see that on her medication list Can we find out the details. If there was side effects on Crestor and reported side effects on Lipitor, she needs to be set up for PCSK9 inhibitor with Zetia 10 mg daily

## 2019-07-18 NOTE — Telephone Encounter (Signed)
Reviewed lab results and recommendations. She had to stop the crestor due to myalgias. She states she had same symptoms with Lipitor. She stopped both of them and does not want to be on any injectable medications. She reports having multiple problems with medications and chemicals that are put into her body. She does not want to do the injections but was agreeable to start the zetia after she returns from vacation the first of next month. She was thankful for the call and recommendations with no further questions at this time.

## 2019-07-19 ENCOUNTER — Ambulatory Visit
Admission: RE | Admit: 2019-07-19 | Discharge: 2019-07-19 | Disposition: A | Discharge status: Home / Self Care | Payer: Medicare HMO | Source: Ambulatory Visit | Attending: Internal Medicine | Admitting: Internal Medicine

## 2019-07-19 ENCOUNTER — Other Ambulatory Visit: Payer: Self-pay

## 2019-07-19 DIAGNOSIS — E042 Nontoxic multinodular goiter: Secondary | ICD-10-CM | POA: Diagnosis not present

## 2019-07-31 ENCOUNTER — Other Ambulatory Visit: Payer: Self-pay | Admitting: Internal Medicine

## 2019-07-31 DIAGNOSIS — I10 Essential (primary) hypertension: Secondary | ICD-10-CM

## 2019-08-01 ENCOUNTER — Telehealth: Payer: Self-pay

## 2019-08-01 NOTE — Telephone Encounter (Signed)
Called and left VM asking pt to complete her Cologuard as soon as possible.  CM

## 2019-09-07 ENCOUNTER — Ambulatory Visit: Payer: Medicare HMO

## 2019-10-17 DIAGNOSIS — H5203 Hypermetropia, bilateral: Secondary | ICD-10-CM | POA: Diagnosis not present

## 2019-10-17 DIAGNOSIS — Z0100 Encounter for examination of eyes and vision without abnormal findings: Secondary | ICD-10-CM | POA: Diagnosis not present

## 2020-01-27 ENCOUNTER — Telehealth: Payer: Self-pay | Admitting: Internal Medicine

## 2020-01-27 NOTE — Telephone Encounter (Signed)
Copied from [place] [phone]. Topic: Medicare AWV >> Jan 27, 2020  9:51 AM Cher Nakai R wrote: Reason for CRM:   Left message for patient to call back and schedule the Medicare Annual Wellness Visit (AWV) in office or virtual  Last AWV 09/01/2018  Please schedule at anytime with [place].  40 minute appointment

## 2020-02-01 ENCOUNTER — Other Ambulatory Visit: Payer: Self-pay | Admitting: Internal Medicine

## 2020-02-01 NOTE — Telephone Encounter (Signed)
Requested medication (s) are due for refill today: Yes  Requested medication (s) are on the active medication list: Yes  Last refill:  03/15/19  Future visit scheduled: Yes  Notes to clinic:  Historical provider.    Requested Prescriptions  Pending Prescriptions Disp Refills   baclofen (LIORESAL) 10 MG tablet 30 each     Sig: Take 0.5 tablets (5 mg total) by mouth at bedtime.      Not Delegated - Analgesics:  Muscle Relaxants Failed - 02/01/2020 12:21 PM      Failed - This refill cannot be delegated      Failed - Valid encounter within last 6 months    Recent Outpatient Visits           6 months ago Annual physical exam   [facility] [name], MD   10 months ago TIA (transient ischemic attack)   [facility] [name], MD   1 year ago Annual physical exam   [facility] [name], MD   2 years ago Essential hypertension   [facility] [name], MD   2 years ago Annual physical exam   [facility] [name], MD       Future Appointments             In 5 months Army Melia Jesse Sans, MD [facility]

## 2020-02-01 NOTE — Telephone Encounter (Signed)
Pt is calling requesting a refill on baclofen 10 mg for muscle spasma . Pt takes medication 3 times a day. Pt stated dr berglund gave her this medication. Pt does have an appt in April 2022. [facility]

## 2020-02-02 ENCOUNTER — Other Ambulatory Visit: Payer: Self-pay | Admitting: Internal Medicine

## 2020-02-02 MED ORDER — BACLOFEN 10 MG PO TABS: 5.0000 mg | ORAL_TABLET | Freq: Every day | ORAL | 0 refills | Status: DC

## 2020-02-02 NOTE — Telephone Encounter (Signed)
Please Advise. Last office visit 07/11/2019.  KP

## 2020-02-16 ENCOUNTER — Telehealth: Payer: Self-pay | Admitting: *Deleted

## 2020-02-16 NOTE — Chronic Care Management (AMB) (Signed)
  Chronic Care Management   Outreach Note  02/16/2020 Name: Carlesha Seiple MRN: [id number] DOB: 1951-01-14  Preslie Depasquale is a 69 y.o. year old female who is a primary care patient of [name], MD. I reached out to [place] by phone today in response to a referral sent by [name] health plan.     A telephone outreach was attempted today patient was not avaliable to talk. The patient was referred to the case management team for assistance with care management and care coordination.   Follow Up Plan: The care management team will reach out to the patient again over the next 7 days. If patient returns call to provider office, please advise to call [place] at [phone].  [place] [facility]  Direct Dial: [phone]

## 2020-02-22 NOTE — Chronic Care Management (AMB) (Signed)
  Chronic Care Management   Note  02/22/2020 Name: Chelsea Santana MRN: [id number] DOB: 11-Nov-1950  Chelsea Santana is a 69 y.o. year old female who is a primary care patient of [name], MD. I reached out to [place] by phone today in response to a referral sent by [name] health plan.     [name] was given information about Chronic Care Management services today including:  1. CCM service includes personalized support from designated clinical staff supervised by her physician, including individualized plan of care and coordination with other care providers 2. 24/7 contact phone numbers for assistance for urgent and routine care needs. 3. Service will only be billed when office clinical staff spend 20 minutes or more in a month to coordinate care. 4. Only one practitioner may furnish and bill the service in a calendar month. 5. The patient may stop CCM services at any time (effective at the end of the month) by phone call to the office staff. 6. The patient will be responsible for cost sharing (co-pay) of up to 20% of the service fee (after annual deductible is met).  Patient agreed to services and verbal consent obtained.   Follow up plan: Telephone appointment with care management team member scheduled for:02/28/2020  [place] [facility]  Direct Dial: [phone]

## 2020-02-23 ENCOUNTER — Telehealth: Payer: Self-pay | Admitting: Cardiovascular Disease

## 2020-02-23 NOTE — Telephone Encounter (Signed)
Patient has moved to the [place] recall

## 2020-02-28 ENCOUNTER — Ambulatory Visit: Payer: Medicare HMO

## 2020-02-28 DIAGNOSIS — I10 Essential (primary) hypertension: Secondary | ICD-10-CM

## 2020-02-28 DIAGNOSIS — E782 Mixed hyperlipidemia: Secondary | ICD-10-CM

## 2020-02-28 NOTE — Progress Notes (Signed)
Chronic Care Management Pharmacy  Name: Chelsea Santana  MRN: [id number] DOB: [birth year]   Chief Complaint/ HPI  Chelsea Santana,  69 y.o. , female presents for their Initial CCM visit with the clinical pharmacist via telephone. Chelsea Santana has recently moved to the [place], [place] but plans to keep Dr. Army Melia as her PCP per her report.   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 Army Melia- annual physical, blood work    Allergies  Allergen Reactions   Antihistamines, Chlorpheniramine-Type Other (See Comments)    Makes her feel high   Oxymetazoline Other (See Comments)    Makes her "high"   Prednisone     lethargy and anger   Rosuvastatin Calcium Other (See Comments)    Myalgias   Statins Other (See Comments)    Muscle Cramps    Lipoic Acid Other (See Comments)    Numbness in hands    Medications: Outpatient Encounter Medications as of 02/28/2020  Medication Sig   aspirin EC 81 MG tablet Take 81 mg by mouth daily.   baclofen (LIORESAL) 10 MG tablet Take 0.5 tablets (5 mg total) by mouth at bedtime.   Cholecalciferol (VITAMIN D) 2000 UNITS CAPS Take 1 capsule by mouth daily.   diclofenac (VOLTAREN) 50 MG EC tablet Take 1 tablet (50 mg total) by mouth 3 (three) times daily.   ezetimibe (ZETIA) 10 MG tablet Take 1 tablet (10 mg total) by mouth daily.   Multiple Vitamins-Minerals (WOMENS 50+ MULTI VITAMIN/MIN) TABS Take by mouth.   Omega-3 Fatty Acids (FISH OIL) 1000 MG CAPS Take by mouth.   OMEGA-3  FATTY ACIDS PO Take by mouth.   No facility-administered encounter medications on file as of 02/28/2020.    Wt Readings from Last 3 Encounters:  07/11/19 170 lb (77.1 kg)  03/21/19 168 lb (76.2 kg)  03/15/19 220 lb (99.8 kg)    Current Diagnosis/Assessment:    Goals Addressed   None     COPD / Asthma / Tobacco   Last spirometry score: Unavailable in chart  urrent COPD Classification:  B (high sx, <2 exacerbations/yr)  Eosinophil count:   Lab Results  Component Value Date/Time   EOSPCT 2 03/15/2019 03:53 PM  %                               Eos (Absolute):  Lab Results  Component Value Date/Time   EOSABS 0.2 07/11/2019 11:06 AM    Tobacco Status:  Social History   Tobacco Use  Smoking Status Current Every Day Smoker   Packs/day: 0.75   Years: 48.00   Pack years: 36.00   Types: Cigarettes  Smokeless Tobacco Never Used    Patient has failed these meds in past: Albuterol Patient is currently uncontrolled on the following medications:   NONE  Using maintenance inhaler regularly? No Frequency of rescue inhaler use:  never  We discussed:  Patient states she can not tolerate inhalers. She states she "has weird side effects to medications. Albuterol gave me hives under my scalp."  She does not recall the  name or previous maintenance but states she could not tolerate. She still smokes 3/4 ppd and has no interest in quitting at this time. She does have a structured exercise plan but endorses staying busy around her house. Especially with recent move. She reports SOB with exertion so she takes breaks as she needs them. She is not interested in trialing a new inhaler at this time. Patient is not interested in lung cancer screening at this time.  Plan  Continue encouraging smoking cessation.  Hypertension/ TIA 03/2019   BP goal is:  <130/80  Office blood pressures are  BP Readings from Last 3 Encounters:  07/11/19 (!) 150/95  03/21/19 (!) 158/92  03/16/19  (!) 155/75   Patient checks BP at home not checking Patient home BP readings are ranging:   Patient has failed these meds in the past: amlodipine, losartan, lisinopril, metoprolol Patient is currently query  controlled on the following medications:   Amlodipine 5 mg twice weekly as needed  We discussed: Patient states she cannot tolerate medications. She states she has experienced dizziness, back pain, fatigue and lethargy to previous medications. She has a wrist BP monitor but not use it stating " I know when my blood pressure is high". She reports the increased stress of recent move has caused her to restart Amlodipine 5 mg twice a week or has she thinks she needs it. I reviewed goal BP values and adverse events including CVA, MI,  And kidney damage. Suggested she would be a good candidate for genetic testing to determine enzyme activity which could help guide future therapy. She is not interested at this time. Counseled patient on SMBG and taking amlodipine daily.  Plan  Continue control with diet and exercise.      Hyperlipidemia/ CAD <50% bilaterally/ Aortic aneurysm   LDL goal < 70  Last lipids Lab Results  Component Value Date   CHOL 242 (H) 07/11/2019   HDL 65 07/11/2019   LDLCALC 155 (H) 07/11/2019   TRIG 127 07/11/2019   CHOLHDL 3.7 07/11/2019   Hepatic Function Latest Ref Rng & Units 07/11/2019 07/08/2018 07/06/2017  Total Protein 6.0 - 8.5 g/dL 7.2 7.4 7.6  Albumin 3.8 - 4.8 g/dL 4.4 4.7 4.8  AST 0 - 40 IU/L $Remov'20 15 18  'CtgRZf$ ALT 0 - 32 IU/L $Remov'18 15 17  'CnzcUy$ Alk Phosphatase 39 - 117 IU/L 62 64 70  Total Bilirubin 0.0 - 1.2 mg/dL 0.3 0.3 0.3     The 10-year ASCVD risk score Mikey Bussing DC Jr., et al., 2013) is: 16.3%   Values used to calculate the score:     Age: 8 years     Sex: Female     Is Non-Hispanic African American: No     Diabetic: No     Tobacco smoker: Yes     Systolic Blood Pressure: 696 mmHg     Is BP treated: No     HDL Cholesterol: 65 mg/dL     Total Cholesterol: 242  mg/dL   Patient has failed these meds in past: Crestor Patient is currently uncontrolled on the following medications:   Aspirin 81 mg qd (taking ~every other day)  Fish oil 1000 mg   We discussed:  Patient refuses to take a statin. She says she has tried to take two previously and she experienced significant leg pain and cramping. She does not recall which statins she has taken previously. She did not try ezetemibe. She states she doesn't take aspirin every day because she sometimes  forgets and she has seen ads stating it isn't good for everyone. Reviewed the new guidelines are no longer recommending for primary prevention but given her history of stroke she should discuss with provider. I reviewed signs and symptoms of bleeding and increased risk given her chronic NSAID use. She denies S/SXS and does take medication with food.  Plan  Continue current medications and control with diet and exercise  Chronic Pain /Lumbar pain with radiculopathy/Osteoarthritis ?   Patient has failed these meds in past: NA Patient is currently controlled on the following medications:   Diclofenac 50 mg tid  Baclofen 5 mg qhs (patient takes 1-2 times daily)  We discussed:  Patient reports taking for "arthritis".  She does not take other OTC NSAIDS, takes with food and denies signs/symptoms of bleeding. She takes baclofen for spasms.  Plan  Continue current medications   Medication Management   Patient's preferred pharmacy is:  [id number] - [place], [place] [place] [address] Phone: [phone] Fax: [phone]  Uses pill box? No - doesn't feel she needs one Pt endorse compliance with diclofenac and baclofen.  We discussed: Patient has moved to [place], [place] area. She plans to keep Dr. Army Melia as PCP and has annual visit scheduled in April.  Plan  Continue current medication management strategy    Follow up: 3 month phone visit  Junita Push.  Kenton Kingfisher PharmD, [facility] [phone]

## 2020-02-28 NOTE — Patient Instructions (Addendum)
Visit Information  It was a pleasure speaking with you today! Thank you for letting me be a part of your care team. Please call with any questions or concerns.  Goals Addressed            This Visit's Progress   . Pharmacy care plan       CARE PLAN ENTRY (see longitudinal plan of care for additional care plan information)  Current Barriers:  . Chronic Disease Management support, education, and care coordination needs related to Hypertension, Hyperlipidemia, GERD, COPD, and H/O TIA, aortic aneursym, thyroid nodules   Hypertension BP Readings from Last 3 Encounters:  07/11/19 (!) 150/95  03/21/19 (!) 158/92  03/16/19 (!) 155/75   . Pharmacist Clinical Goal(s): o Over the next 90 days, patient will work with PharmD and providers to achieve BP goal <130/80 . Current regimen:  o Amlodipine 5 mg (patient takes twice weekly ) . Interventions: o The patient is asked to make an attempt to improve diet and exercise patterns to aid in medical management of this problem. o Counseled on proper BP technique including sitting down for at least 5 minutes before, resting calmly, with your feet flat on the floor.   o Counseled on adverse events associated with uncontrolled hypertension . Patient self care activities - Over the next 90 days, patient will: o Check BP 1-2 times weekly, document, and provide at future appointments o Ensure daily salt intake < 2300 mg/day  Hyperlipidemia Lab Results  Component Value Date/Time   LDLCALC 155 (H) 07/11/2019 11:06 AM   . Pharmacist Clinical Goal(s): o Over the next 90 days, patient will work with PharmD and providers to achieve LDL goal < 70 . Current regimen:  o Aspirin 81 mg twice weekly o Fish Oil 1000 mg qd . Interventions: o Provided diet and exercise counseling. . Patient self care activities - Over the next 90  days, patient will: o Focus on diet and exercise    Medication management . Pharmacist Clinical Goal(s): o Over the next 90  days, patient will work with PharmD and providers to achieve optimal medication adherence . [facility] . Interventions o Comprehensive medication review performed. o Continue current medication management strategy . Patient self care activities - Over the next 90 days, patient will: o Focus on medication adherence by fill dates o Take medications as prescribed o Report any questions or concerns to PharmD and/or provider(s)  Initial goal documentation        Chelsea Santana was given information about Chronic Care Management services today including:  1. CCM service includes personalized support from designated clinical staff supervised by her physician, including individualized plan of care and coordination with other care providers 2. 24/7 contact phone numbers for assistance for urgent and routine care needs. 3. Standard insurance, coinsurance, copays and deductibles apply for chronic care management only during months in which we provide at least 20 minutes of these services. Most insurances cover these services at 100%, however patients may be responsible for any copay, coinsurance and/or deductible if applicable. This service may help you avoid the need for more expensive face-to-face services. 4. Only one practitioner may furnish and bill the service in a calendar month. 5. The patient may stop CCM services at any time (effective at the end of the month) by phone call to the office staff.  Patient agreed to services and verbal consent obtained.   The patient verbalized understanding of instructions, educational materials, and care plan provided today and  agreed to receive a mailed copy of patient instructions, educational materials, and care plan.  Telephone follow up appointment with pharmacy team member scheduled for: 3 months  Junita Push. Kenton Kingfisher PharmD, BCPS Clinical Pharmacist [phone]  Dyslipidemia Dyslipidemia is an imbalance of waxy, fat-like substances (lipids)  in the blood. The body needs lipids in small amounts. Dyslipidemia often involves a high level of cholesterol or triglycerides, which are types of lipids. Common forms of dyslipidemia include:  High levels of LDL cholesterol. LDL is the type of cholesterol that causes fatty deposits (plaques) to build up in the blood vessels that carry blood away from your heart (arteries).  Low levels of HDL cholesterol. HDL cholesterol is the type of cholesterol that protects against heart disease. High levels of HDL remove the LDL buildup from arteries.  High levels of triglycerides. Triglycerides are a fatty substance in the blood that is linked to a buildup of plaques in the arteries. What are the causes? Primary dyslipidemia is caused by changes (mutations) in genes that are passed down through families (inherited). These mutations cause several types of dyslipidemia. Secondary dyslipidemia is caused by lifestyle choices and diseases that lead to dyslipidemia, such as:  Eating a diet that is high in animal fat.  Not getting enough exercise.  Having diabetes, kidney disease, liver disease, or thyroid disease.  Drinking large amounts of alcohol.  Using certain medicines. What increases the risk? You are more likely to develop this condition if you are an older man or if you are a woman who has gone through menopause. Other risk factors include:  Having a family history of dyslipidemia.  Taking certain medicines, including birth control pills, steroids, some diuretics, and beta-blockers.  Smoking cigarettes.  Eating a high-fat diet.  Having certain medical conditions such as diabetes, polycystic ovary syndrome (PCOS), kidney disease, liver disease, or hypothyroidism.  Not exercising regularly.  Being overweight or obese with too much belly fat. What are the signs or symptoms? In most cases, dyslipidemia does not usually cause any symptoms. In severe cases, very high lipid levels can  cause:  Fatty bumps under the skin (xanthomas).  White or gray ring around the black center (pupil) of the eye. Very high triglyceride levels can cause inflammation of the pancreas (pancreatitis). How is this diagnosed? Your health care provider may diagnose dyslipidemia based on a routine blood test (fasting blood test). Because most people do not have symptoms of the condition, this blood testing (lipid profile) is done on adults age 69 and older and is repeated every 5 years. This test checks:  Total cholesterol. This measures the total amount of cholesterol in your blood, including LDL cholesterol, HDL cholesterol, and triglycerides. A healthy number is below 200.  LDL cholesterol. The target number for LDL cholesterol is different for each person, depending on individual risk factors. Ask your health care provider what your LDL cholesterol should be.  HDL cholesterol. An HDL level of 60 or higher is best because it helps to protect against heart disease. A number below 69 for men or below 52 for women increases the risk for heart disease.  Triglycerides. A healthy triglyceride number is below 150. If your lipid profile is abnormal, your health care provider may do other blood tests. How is this treated? Treatment depends on the type of dyslipidemia that you have and your other risk factors for heart disease and stroke. Your health care provider will have a target range for your lipid levels based on this information. For  many people, this condition may be treated by lifestyle changes, such as diet and exercise. Your health care provider may recommend that you:  Get regular exercise.  Make changes to your diet.  Quit smoking if you smoke. If diet changes and exercise do not help you reach your goals, your health care provider may also prescribe medicine to lower lipids. The most commonly prescribed type of medicine lowers your LDL cholesterol (statin drug). If you have a high  triglyceride level, your provider may prescribe another type of drug (fibrate) or an omega-3 fish oil supplement, or both. Follow these instructions at home:  Eating and drinking  Follow instructions from your health care provider or dietitian about eating or drinking restrictions.  Eat a healthy diet as told by your health care provider. This can help you reach and maintain a healthy weight, lower your LDL cholesterol, and raise your HDL cholesterol. This may include: ? Limiting your calories, if you are overweight. ? Eating more fruits, vegetables, whole grains, fish, and lean meats. ? Limiting saturated fat, trans fat, and cholesterol.  If you drink alcohol: ? Limit how much you use. ? Be aware of how much alcohol is in your drink. In the [place] one drink equals one 12 oz bottle of beer (355 mL), one 5 oz glass of wine (148 mL), or one 1 oz glass of hard liquor (44 mL).  Do not drink alcohol if: ? Your health care provider tells you not to drink. ? You are pregnant, may be pregnant, or are planning to become pregnant. Activity  Get regular exercise. Start an exercise and strength training program as told by your health care provider. Ask your health care provider what activities are safe for you. Your health care provider may recommend: ? 30 minutes of aerobic activity 4-6 days a week. Brisk walking is an example of aerobic activity. ? Strength training 2 days a week. General instructions  Do not use any products that contain nicotine or tobacco, such as cigarettes, e-cigarettes, and chewing tobacco. If you need help quitting, ask your health care provider.  Take over-the-counter and prescription medicines only as told by your health care provider. This includes supplements.  Keep all follow-up visits as told by your health care provider. Contact a health care provider if:  You are: ? Having trouble sticking to your exercise or diet plan. ? Struggling to quit smoking or control  your use of alcohol. Summary  Dyslipidemia often involves a high level of cholesterol or triglycerides, which are types of lipids.  Treatment depends on the type of dyslipidemia that you have and your other risk factors for heart disease and stroke.  For many people, treatment starts with lifestyle changes, such as diet and exercise.  Your health care provider may prescribe medicine to lower lipids. This information is not intended to replace advice given to you by your health care provider. Make sure you discuss any questions you have with your health care provider. Document Revised: 11/16/2017 Document Reviewed: 10/23/2017 Elsevier Patient Education  [place].

## 2020-03-02 ENCOUNTER — Other Ambulatory Visit: Payer: Self-pay | Admitting: Internal Medicine

## 2020-03-29 ENCOUNTER — Other Ambulatory Visit: Payer: Self-pay | Admitting: Internal Medicine

## 2020-03-29 NOTE — Telephone Encounter (Signed)
Left voicemail to have patient call back to set up appointment. 

## 2020-03-29 NOTE — Telephone Encounter (Signed)
Requested medication (s) are due for refill today - yes  Requested medication (s) are on the active medication list -yes  Future visit scheduled -yes  Last refill: 02/02/20  Notes to clinic: Request non delegated Rx  Requested Prescriptions  Pending Prescriptions Disp Refills   baclofen (LIORESAL) 10 MG tablet [Pharmacy Med Name: BACLOFEN 10 MG TABLET] 30 tablet     Sig: TAKE 1/2 TABLET BY MOUTH AT BEDTIME.      Not Delegated - Analgesics:  Muscle Relaxants Failed - 03/29/2020  8:30 AM      Failed - This refill cannot be delegated      Failed - Valid encounter within last 6 months    Recent Outpatient Visits           8 months ago Annual physical exam   [facility] [name], MD   1 year ago TIA (transient ischemic attack)   [facility] [name], MD   1 year ago Annual physical exam   [facility] [name], MD   2 years ago Essential hypertension   [facility] [name], MD   2 years ago Annual physical exam   [facility] [name], MD       Future Appointments             In 3 months Army Melia Jesse Sans, MD [facility], [facility]                 Requested Prescriptions  Pending Prescriptions Disp Refills   baclofen (LIORESAL) 10 MG tablet [Pharmacy Med Name: BACLOFEN 10 MG TABLET] 30 tablet     Sig: TAKE 1/2 TABLET BY MOUTH AT BEDTIME.      Not Delegated - Analgesics:  Muscle Relaxants Failed - 03/29/2020  8:30 AM      Failed - This refill cannot be delegated      Failed - Valid encounter within last 6 months    Recent Outpatient Visits           8 months ago Annual physical exam   [facility] [name], MD   1 year ago TIA (transient ischemic attack)   [facility] [name], MD   1 year ago Annual physical exam   [facility] [name], MD   2 years ago Essential hypertension   [facility]  [name], MD   2 years ago Annual physical exam   [facility] [name], MD       Future Appointments             In 3 months Army Melia Jesse Sans, MD [facility], [facility]

## 2020-03-29 NOTE — Telephone Encounter (Signed)
Please call pt and schedule f/u appt for HTN.  KP

## 2020-04-12 ENCOUNTER — Other Ambulatory Visit: Payer: Self-pay | Admitting: Internal Medicine

## 2020-07-03 ENCOUNTER — Telehealth: Payer: Self-pay | Admitting: Internal Medicine

## 2020-07-03 NOTE — Telephone Encounter (Signed)
Copied from [place] [id number]. Topic: Medicare AWV >> Jul 03, 2020  3:31 PM Gerre Pebbles R wrote: Reason for CRM: Left message for patient to call back and schedule Medicare Annual Wellness Visit (AWV) either virtually or in office. Whichever the patients preference is.  Last AWV 09/01/18; please schedule at anytime with [facility] Advisor.  This should be a 40 minute visit.

## 2020-07-11 ENCOUNTER — Encounter: Payer: Medicare HMO | Admitting: Internal Medicine

## 2020-09-12 ENCOUNTER — Other Ambulatory Visit: Payer: Self-pay | Admitting: Internal Medicine

## 2020-09-12 MED ORDER — DICLOFENAC SODIUM 50 MG PO TBEC: 50.0000 mg | DELAYED_RELEASE_TABLET | Freq: Three times a day (TID) | ORAL | 0 refills | Status: AC

## 2020-09-12 MED ORDER — BACLOFEN 10 MG PO TABS: ORAL_TABLET | ORAL | 0 refills | Status: AC

## 2020-09-12 NOTE — Telephone Encounter (Signed)
Requested medication (s) are due for refill today:no  Requested medication (s) are on the active medication list: yes   Future visit scheduled:no   Notes to clinic:  overdue for appt Appt on 07/11/2020 was canceled and has not been rescheduled    Requested Prescriptions  Pending Prescriptions Disp Refills   baclofen (LIORESAL) 10 MG tablet 45 tablet 1      Not Delegated - Analgesics:  Muscle Relaxants Failed - 09/12/2020  9:35 AM      Failed - This refill cannot be delegated      Failed - Valid encounter within last 6 months    Recent Outpatient Visits           1 year ago Annual physical exam   [facility] [name], MD   1 year ago TIA (transient ischemic attack)   [facility] [name], MD   2 years ago Annual physical exam   [facility] [name], MD   2 years ago Essential hypertension   [facility] [name], MD   3 years ago Annual physical exam   [facility] [name], MD                  diclofenac (VOLTAREN) 50 MG EC tablet 270 tablet 1    Sig: Take 1 tablet (50 mg total) by mouth 3 (three) times daily.      Analgesics:  NSAIDS Failed - 09/12/2020  9:35 AM      Failed - Cr in normal range and within 360 days    Creatinine, Ser  Date Value Ref Range Status  07/11/2019 0.86 0.57 - 1.00 mg/dL Final          Failed - HGB in normal range and within 360 days    Hemoglobin  Date Value Ref Range Status  07/11/2019 13.7 11.1 - 15.9 g/dL Final          Failed - Valid encounter within last 12 months    Recent Outpatient Visits           1 year ago Annual physical exam   [facility] [name], MD   1 year ago TIA (transient ischemic attack)   [facility] [name], MD   2 years ago Annual physical exam   [facility] [name], MD   2 years ago Essential hypertension   Taylor, Laura H, MD    3 years ago Annual physical exam   [facility] [name], MD                Passed - Patient is not pregnant

## 2020-09-12 NOTE — Telephone Encounter (Signed)
Copied from [place] [id number]. Topic: Quick Communication - Rx Refill/Question >> Sep 12, 2020  9:29 AM Mcneil, Ja-Kwan wrote: Medication: baclofen (LIORESAL) 10 MG tablet and diclofenac (VOLTAREN) 50 MG EC tablet  Has the patient contacted their pharmacy? yes   Preferred Pharmacy (with phone number or street name): [place], [place] Phone: [phone]  Fax: [phone]  Agent: Please be advised that RX refills may take up to 3 business days. We ask that you follow-up with your pharmacy.

## 2020-10-04 ENCOUNTER — Other Ambulatory Visit: Payer: Self-pay | Admitting: Internal Medicine

## 2020-10-04 NOTE — Telephone Encounter (Signed)
   Notes to clinic:  patient canceled last appt that was scheduled for 07/11/2020 Review for refill    Requested Prescriptions  Pending Prescriptions Disp Refills   diclofenac (VOLTAREN) 50 MG EC tablet [Pharmacy Med Name: DICLOFENAC SODIUM DR 50 MG Tablet Delayed Release] 90 tablet 0    Sig: Take 1 tablet (50 mg total) by mouth 3 (three) times daily.      Analgesics:  NSAIDS Failed - 10/04/2020  3:20 AM      Failed - Cr in normal range and within 360 days    Creatinine, Ser  Date Value Ref Range Status  07/11/2019 0.86 0.57 - 1.00 mg/dL Final          Failed - HGB in normal range and within 360 days    Hemoglobin  Date Value Ref Range Status  07/11/2019 13.7 11.1 - 15.9 g/dL Final          Failed - Valid encounter within last 12 months    Recent Outpatient Visits           1 year ago Annual physical exam   [facility] [name], MD   1 year ago TIA (transient ischemic attack)   [facility] [name], MD   2 years ago Annual physical exam   [facility] [name], MD   2 years ago Essential hypertension   [place], Laura H, MD   3 years ago Annual physical exam   [facility] [name], MD                Passed - Patient is not pregnant

## 2020-10-04 NOTE — Telephone Encounter (Signed)
Pt was scheduled for a cpe on 07/11/20 but canceled in march due to no longer living in the area. Dr. Army Melia has been removed as her pcp.

## 2020-10-04 NOTE — Telephone Encounter (Signed)
Please call pt to schedule a physical.  KP

## 2021-01-12 ENCOUNTER — Other Ambulatory Visit: Payer: Self-pay | Admitting: Internal Medicine

## 2021-01-12 NOTE — Telephone Encounter (Signed)
Per note 10/04/20 pt no longer under Dr. Gaspar Cola care- pt has moved

## 2021-04-10 IMAGING — CT CT ANGIOGRAPHY CHEST
2 of 4 series · 10 of 16 positions shown · IV contrast (APPLIED)
Comparison: 06/02/2017, 07/29/2016, 06/07/2015

CLINICAL DATA: Follow-up MARKO MARIJANKA, history of thymic cancer since I neck
pain

EXAM:
CT ANGIOGRAPHY CHEST WITH CONTRAST
TECHNIQUE: Multidetector CT imaging of the chest was performed using the
standard protocol during bolus administration of intravenous
contrast. Multiplanar CT image reconstructions and MIPs were
obtained to evaluate the vascular anatomy.
CONTRAST:  100mL [id number] IOPAMIDOL (9F1CKP-O2F) INJECTION 76%

[Series 4: axial arterial · axial · arterial · 0.71mm/px · z∈[-455,-247]mm · 5 of 156 slices shown]
[im 26/156  lung]
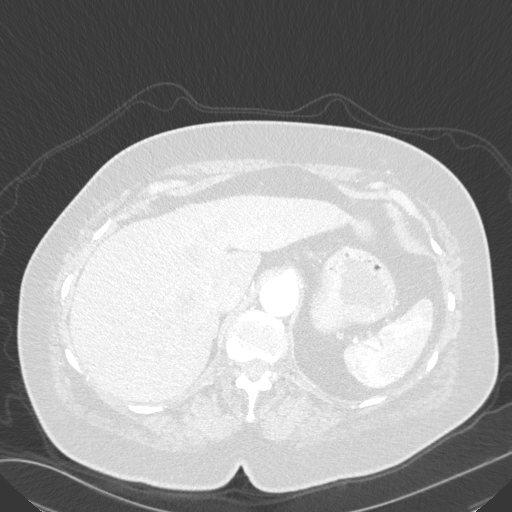
[im 52/156  soft-tissue]
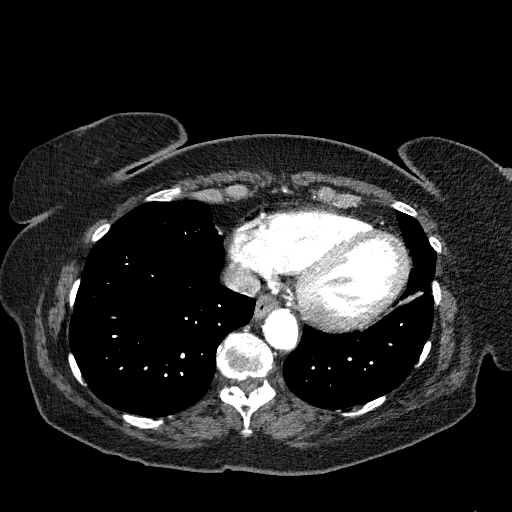
[im 78/156  lung]
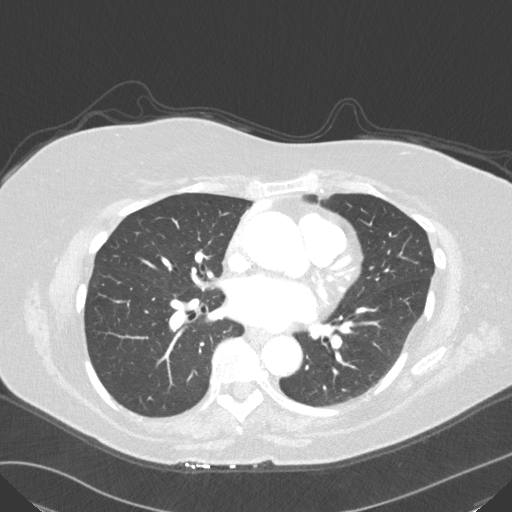
[im 104/156  soft-tissue]
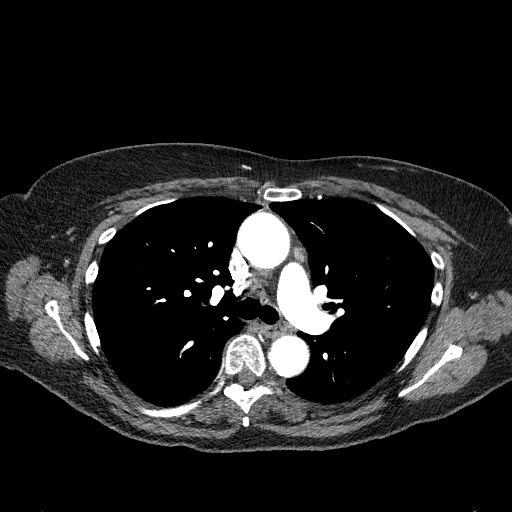
[im 130/156  lung]
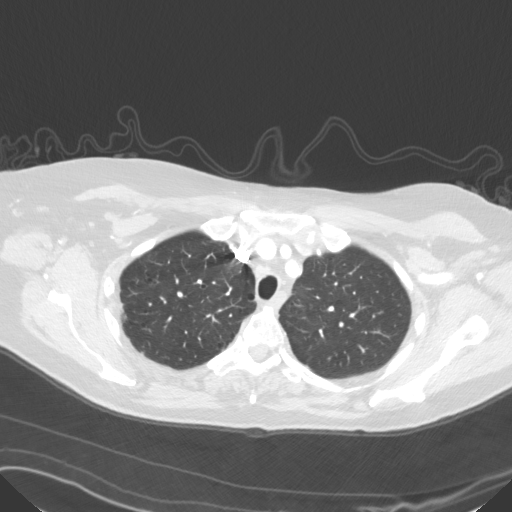

[Series 5: lung · axial · 0.71mm/px · z∈[-455,-247]mm · 5 of 156 slices shown]
[im 26/156  soft-tissue]
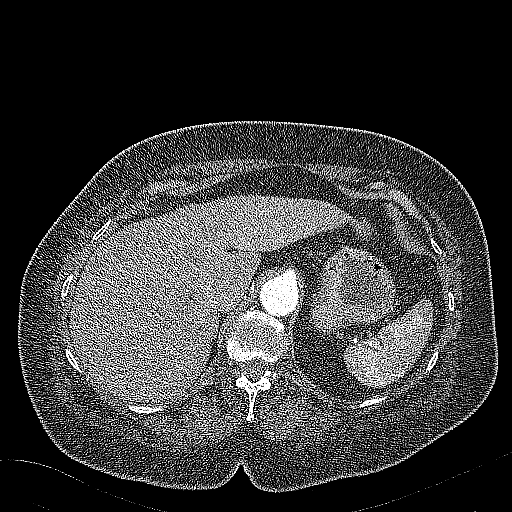
[im 52/156  soft-tissue]
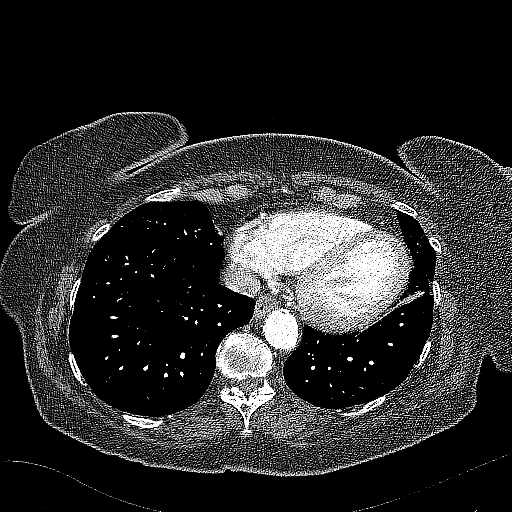
[im 78/156  soft-tissue]
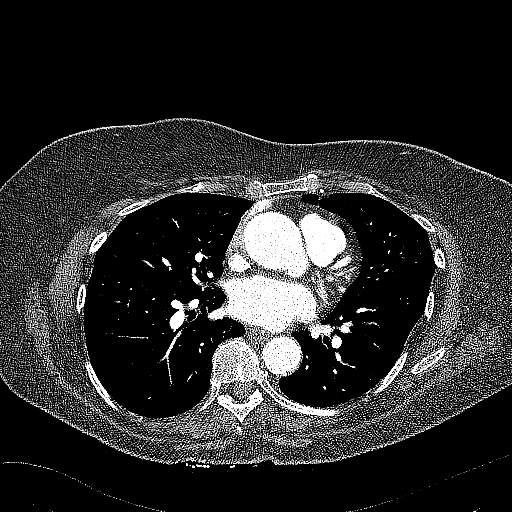
[im 104/156  soft-tissue]
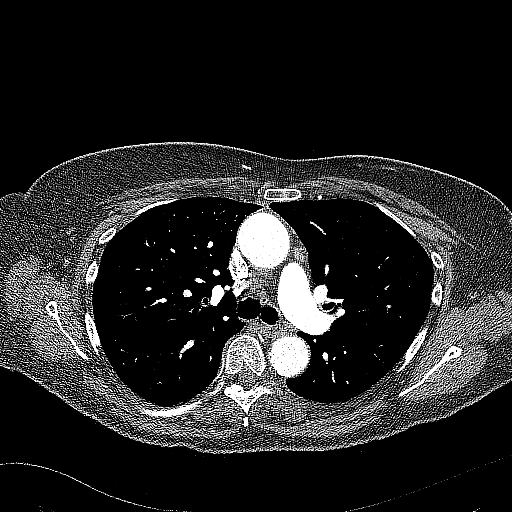
[im 130/156  soft-tissue]
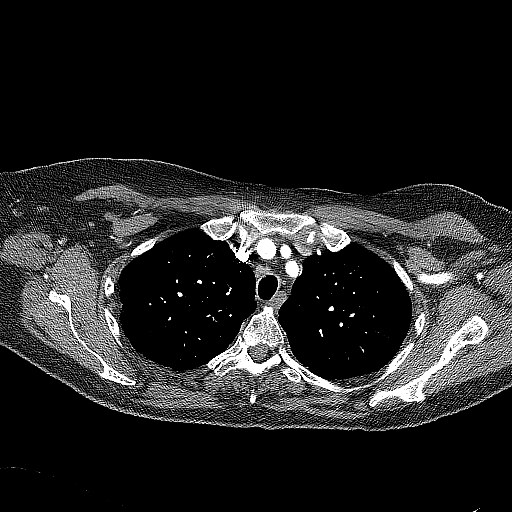

[10 of 16 positions shown; findings below may reference images not displayed]

FINDINGS: Cardiovascular: No change in caliber of the tubular ascending
thoracic aorta, measuring 3.9 x 3.9 cm. Moderate mixed aortic
atherosclerosis. Three-vessel coronary artery calcifications. Normal
heart size. No pericardial effusion.

Mediastinum/Nodes: No evidence of abnormal or recurrent anterior
mediastinal soft tissue status post thymectomy. No enlarged
mediastinal, hilar, or axillary lymph nodes. Thyroid gland, trachea,
and esophagus demonstrate no significant findings.

Lungs/Pleura: Bandlike scarring of the left lung base. No pleural
effusion or pneumothorax.

Upper Abdomen: No acute abnormality.

Musculoskeletal: No chest wall abnormality. No acute or significant
osseous findings.

Review of the MIP images confirms the above findings.
IMPRESSION: 1. No change in caliber of the tubular ascending thoracic aorta,
measuring 3.9 x 3.9 cm. Moderate mixed aortic atherosclerosis.

2. No evidence of abnormal or recurrent anterior mediastinal soft
tissue status post thymectomy. Stable small mediastinal lymph nodes.

3.  Coronary artery disease.
